# Patient Record
Sex: Female | Born: 1990 | Race: White | Hispanic: No | Marital: Single | Smoking: Never smoker
Health system: Southern US, Community
[De-identification: ages and names within clinical notes are randomized; demographics above are authoritative.]

## PROBLEM LIST (undated history)

## (undated) DIAGNOSIS — Z87442 Personal history of urinary calculi: Secondary | ICD-10-CM

## (undated) DIAGNOSIS — M1990 Unspecified osteoarthritis, unspecified site: Secondary | ICD-10-CM

## (undated) HISTORY — DX: Unspecified osteoarthritis, unspecified site: M19.90

## (undated) HISTORY — PX: WISDOM TOOTH EXTRACTION: SHX21

## (undated) HISTORY — DX: Personal history of urinary calculi: Z87.442

---

## 2021-05-11 ENCOUNTER — Ambulatory Visit (INDEPENDENT_AMBULATORY_CARE_PROVIDER_SITE_OTHER): Payer: Commercial Managed Care - PPO | Admitting: Family Medicine

## 2021-05-11 ENCOUNTER — Encounter: Payer: Self-pay | Admitting: Family Medicine

## 2021-05-11 VITALS — BP 130/82 | HR 87 | Temp 98.3°F | Ht 62.0 in | Wt 171.2 lb

## 2021-05-11 DIAGNOSIS — R002 Palpitations: Secondary | ICD-10-CM | POA: Diagnosis not present

## 2021-05-11 DIAGNOSIS — G8929 Other chronic pain: Secondary | ICD-10-CM

## 2021-05-11 DIAGNOSIS — M25561 Pain in right knee: Secondary | ICD-10-CM

## 2021-05-11 DIAGNOSIS — M25562 Pain in left knee: Secondary | ICD-10-CM

## 2021-05-11 DIAGNOSIS — F4323 Adjustment disorder with mixed anxiety and depressed mood: Secondary | ICD-10-CM | POA: Diagnosis not present

## 2021-05-11 DIAGNOSIS — G4489 Other headache syndrome: Secondary | ICD-10-CM

## 2021-05-11 DIAGNOSIS — Z8249 Family history of ischemic heart disease and other diseases of the circulatory system: Secondary | ICD-10-CM

## 2021-05-11 DIAGNOSIS — R1084 Generalized abdominal pain: Secondary | ICD-10-CM | POA: Diagnosis not present

## 2021-05-11 DIAGNOSIS — R519 Headache, unspecified: Secondary | ICD-10-CM

## 2021-05-11 DIAGNOSIS — R0789 Other chest pain: Secondary | ICD-10-CM

## 2021-05-11 LAB — CBC WITH DIFFERENTIAL/PLATELET
Basophils Absolute: 0 10*3/uL (ref 0.0–0.1)
Basophils Relative: 0.4 % (ref 0.0–3.0)
Eosinophils Absolute: 0.1 10*3/uL (ref 0.0–0.7)
Eosinophils Relative: 1 % (ref 0.0–5.0)
HCT: 44 % (ref 36.0–46.0)
Hemoglobin: 14.6 g/dL (ref 12.0–15.0)
Lymphocytes Relative: 30.3 % (ref 12.0–46.0)
Lymphs Abs: 2.1 10*3/uL (ref 0.7–4.0)
MCHC: 33.2 g/dL (ref 30.0–36.0)
MCV: 90.9 fl (ref 78.0–100.0)
Monocytes Absolute: 0.3 10*3/uL (ref 0.1–1.0)
Monocytes Relative: 4.2 % (ref 3.0–12.0)
Neutro Abs: 4.4 10*3/uL (ref 1.4–7.7)
Neutrophils Relative %: 64.1 % (ref 43.0–77.0)
Platelets: 399 10*3/uL (ref 150.0–400.0)
RBC: 4.84 Mil/uL (ref 3.87–5.11)
RDW: 13.5 % (ref 11.5–15.5)
WBC: 6.9 10*3/uL (ref 4.0–10.5)

## 2021-05-11 LAB — URINALYSIS, ROUTINE W REFLEX MICROSCOPIC
Ketones, ur: NEGATIVE
Nitrite: NEGATIVE
Specific Gravity, Urine: >=1.03 — AB (ref 1.000–1.030)
Urine Glucose: NEGATIVE
Urobilinogen, UA: 0.2 (ref 0.0–1.0)
pH: 5.5 (ref 5.0–8.0)

## 2021-05-11 LAB — COMPREHENSIVE METABOLIC PANEL
ALT: 10 U/L (ref 0–35)
AST: 14 U/L (ref 0–37)
Albumin: 4 g/dL (ref 3.5–5.2)
Alkaline Phosphatase: 72 U/L (ref 39–117)
BUN: 10 mg/dL (ref 6–23)
CO2: 24 mEq/L (ref 19–32)
Calcium: 8.9 mg/dL (ref 8.4–10.5)
Chloride: 106 mEq/L (ref 96–112)
Creatinine, Ser: 0.67 mg/dL (ref 0.40–1.20)
GFR: 116.59 mL/min (ref >60.00)
Glucose, Bld: 95 mg/dL (ref 70–99)
Potassium: 4.1 mEq/L (ref 3.5–5.1)
Sodium: 139 mEq/L (ref 135–145)
Total Bilirubin: 0.5 mg/dL (ref 0.2–1.2)
Total Protein: 7 g/dL (ref 6.0–8.3)

## 2021-05-11 LAB — HEMOGLOBIN A1C: Hgb A1c MFr Bld: 5.4 % (ref 4.6–6.5)

## 2021-05-11 MED ORDER — MELOXICAM 15 MG PO TABS: 15.0000 mg | ORAL_TABLET | Freq: Every day | ORAL | 1 refills | Status: DC

## 2021-05-11 MED ORDER — DULOXETINE HCL 30 MG PO CPEP: 30.0000 mg | ORAL_CAPSULE | Freq: Every day | ORAL | 1 refills | Status: DC

## 2021-05-11 NOTE — Patient Instructions (Signed)
Welcome to [facility] at [facility]! It was a pleasure meeting you today.  As discussed, Please schedule a 1 month follow up visit today.  PLEASE NOTE:  If you had any LAB tests please let us know if you have not heard back within a few days. You may see your results on MyChart before we have a chance to review them but we will give you a call once they are reviewed by us. If we ordered any REFERRALS today, please let us know if you have not heard from their office within the next week.  Let us know through MyChart if you are needing REFILLS, or have your pharmacy send us the request. You can also use MyChart to communicate with me or any office staff.  Please try these tips to maintain a healthy lifestyle:  Eat most of your calories during the day when you are active. Eliminate processed foods including packaged sweets (pies, cakes, cookies), reduce intake of potatoes, white bread, white pasta, and white rice. Look for whole grain options, oat flour or almond flour.  Each meal should contain half fruits/vegetables, one quarter protein, and one quarter carbs (no bigger than a computer mouse).  Cut down on sweet beverages. This includes juice, soda, and sweet tea. Also watch fruit intake, though this is a healthier sweet option, it still contains natural sugar! Limit to 3 servings daily.  Drink at least 1 glass of water with each meal and aim for at least 8 glasses per day  Exercise at least 150 minutes every week.   

## 2021-05-11 NOTE — Progress Notes (Signed)
? ?New Patient Office Visit ? ?Subjective:  ?Patient ID: Belinda Rice, female    DOB: Feb 04, 1991  Age: 31 y.o. MRN: [id number] ? ?CC:  ?Chief Complaint  ?Patient presents with  ? Establish Care  ?  Need new PCP  ? ? ?HPI ?Claretta Kimpel presents for new pt.  Mom is nurse and encouraged pt to ask for certain things ? ?New pt.  From [place]-Living in [place].   Pre-Law student ? Has some type of heart condition-saw Card in [place] at [facility] a serious heart condition".  Gets light-headed/tachy.  W/u in 03/2018.   Went to [employer] in [place]. [place]-has "flap" on ventricle.   Can get CP, lightheaded, cough, HR >100 freq.  ??EDS or Mast Cell-was supposed to refer to Genetics-joints can "slip in and out".  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   Elbow can slip while in bed.  Has some arthritis in knees-pain at times. ?Fx nose in past-so nose causing some tension HA -pressure in eyes/face-some photo. Pt requesting to see ENT ?Abd pain-has had polyps in intestines when 31yo-cscope for BRBR.  No stool changes, but more pain in abd-not related to food or lack of.  Occ shooting pains L side/kidney-stabbing at times.  ?FH DM pat side ?1/2 bro dx brain aneurysm at 55 and Gma had as well-used to get MRI's in [place].   ? ?LMP Dec-on nuvaring-stressed.  U preg neg ?Stress-not do well on prozac-kept her awake. Saw friend get killed.in past-ADD meds.  Procrastinates.  No SI ?Jewish ? ? ? ?Past Medical History:  ?Diagnosis Date  ? Arthritis   ? History of kidney stones   ? ? ?Past Surgical History:  ?Procedure Laterality Date  ? WISDOM TOOTH EXTRACTION    ? ? ?Family History  ?Problem Relation Age of Onset  ? Diabetes Father   ? Cancer Maternal Grandmother   ? Brain cancer Maternal Grandmother   ? Prostate cancer Maternal Grandfather   ? Cancer Maternal Grandfather   ? ? ?Social History  ? ?Socioeconomic History  ? Marital status: Single  ?  Spouse name: Not on file  ? Number of  children: 0  ? Years of education: Not on file  ? Highest education level: Not on file  ?Occupational History  ? Not on file  ?Tobacco Use  ? Smoking status: Never  ? Smokeless tobacco: Never  ?Vaping Use  ? Vaping Use: Never used  ?Substance and Sexual Activity  ? Alcohol use: Never  ? Drug use: Never  ? Sexual activity: Yes  ?  Comment: Nuvaring  ?Other Topics Concern  ? Not on file  ?Social History Narrative  ? Part-time Bakery  ? 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  ? ?Social Determinants of Health  ? ?Financial Resource Strain: Not on file  ?Food Insecurity: Not on file  ?Transportation Needs: Not on file  ?Physical Activity: Not on file  ?Stress: Not on file  ?Social Connections: Not on file  ?Intimate Partner Violence: Not on file  ? ? ?ROS -see HPI-time constraints so not able to complete ? ? ?Objective:  ? ?Today's Vitals: BP 130/82   Pulse 87   Temp 98.3 ?F (36.8 ?C) (Temporal)   Ht '5\' 2"'$  (1.575 m)   Wt 171 lb 4 oz (77.7 kg)   LMP  (LMP Unknown) Comment: think it was December, due to stress  SpO2 98%   BMI 31.32 kg/m?  ? ?Physical Exam  ?Gen:  WDWN NAD OWF ?HEENT: NCAT, conjunctiva not injected, sclera nonicteric ?TM WNL B, OP moist, no exudates  ?NECK:  supple, no thyromegaly, no nodes, no carotid bruits ?CARDIAC: RRR, S1S2+, no murmur. DP 2+B ?LUNGS: CTAB. No wheezes ?ABDOMEN:  BS+, soft, mildly tender all over. No HSM, no masses ?EXT:  no edema ?MSK: no gross abnormalities.  ?NEURO: A&O x3.  CN II-XII intact.  ?PSYCH: normal mood. Good eye contact  ? ?Assessment & Plan:  ? ?Problem List Items Addressed This Visit   ? ?  ? Cardiovascular and Mediastinum  ? Family history of brain aneurysm  ?  ? Other  ? Chronic pain of both knees  ? Relevant Medications  ? DULoxetine (CYMBALTA) 30 MG capsule  ? meloxicam (MOBIC) 15 MG tablet  ? Other Relevant Orders  ? Ambulatory referral to Orthopedic Surgery  ? ?Other Visit Diagnoses   ? ? Adjustment disorder with mixed anxiety and depressed  mood    -  Primary  ? Palpitation      ? Relevant Orders  ? CBC with Differential/Platelet  ? Comprehensive metabolic panel  ? Hemoglobin A1c  ? TSH  ? Urinalysis, Routine w reflex microscopic  ? Ambulatory referral to Cardiology  ? Other headache syndrome      ? Relevant Medications  ? DULoxetine (CYMBALTA) 30 MG capsule  ? meloxicam (MOBIC) 15 MG tablet  ? Generalized abdominal pain      ? Relevant Orders  ? CBC with Differential/Platelet  ? Comprehensive metabolic panel  ? Hemoglobin A1c  ? TSH  ? hCG, serum, qualitative  ? Urinalysis, Routine w reflex microscopic  ? CT Abdomen Pelvis W Contrast  ? Other chest pain      ? Relevant Orders  ? Ambulatory referral to Cardiology  ? ?  ?1.  Adjustment disorder with anxiety/depression-we will start Cymbalta.  Follow-up in 1 month ?2.  Palpitations/chest pain/structural heart disease-Will refer to cardiology.  Check CBC, CMP, TSH for other causes ?3.  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  Requesting referral to geneticist.  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  Will trial Cymbalta and meloxicam.  Work on weight loss. ?4.  Diffuse abdominal pain.  Has history of intestinal polyps.  Has a lot going on.  Will check serum pregnancy test (urine pregnancy test is already been negative at home.).  Check CBC, CMP, UA.  Refer for CT abdomen ?5.  Chronic headaches-patient feels this is due to an old nasal fracture.  She is requesting a referral to ENT.  Also, family history of aneurysms.  She used to get MRIs.  Adequately done in [place].  We will schedule for an MRI. ? ?Complex patient.  Spent 40 minutes listening to the patient, obtaining her history.  Advised that we could not address all of her needs at 1 time.  Spent an additional 10 minutes devising a plan, ordering labs, medications, and discussing side effects.  Total of 50 minutes spent face-to-face with the patient. ?Outpatient Encounter  Medications as of 05/11/2021  ?Medication Sig  ? DULoxetine (CYMBALTA) 30 MG capsule Take 1 capsule (30 mg total) by mouth daily.  ? etonogestrel-ethinyl estradiol (NUVARING) 0.12-0.015 MG/24HR vaginal ring Place vaginally.  ? meloxicam (MOBIC) 15 MG tablet Take 1 tablet (15 mg total) by mouth daily.  ? ?No facility-administered encounter medications on file as of 05/11/2021.  ? ? ?Follow-up: Return for 1 mo-f/u moods, ha.  ? ?Wellington Hampshire,  MD ? ?

## 2021-05-13 DIAGNOSIS — Z8249 Family history of ischemic heart disease and other diseases of the circulatory system: Secondary | ICD-10-CM | POA: Insufficient documentation

## 2021-05-15 ENCOUNTER — Other Ambulatory Visit (HOSPITAL_BASED_OUTPATIENT_CLINIC_OR_DEPARTMENT_OTHER): Payer: Self-pay | Admitting: Orthopaedic Surgery

## 2021-05-15 DIAGNOSIS — M25561 Pain in right knee: Secondary | ICD-10-CM

## 2021-05-15 LAB — TSH: TSH: 1.3 u[IU]/mL (ref 0.35–5.50)

## 2021-05-16 ENCOUNTER — Other Ambulatory Visit: Payer: Self-pay | Admitting: *Deleted

## 2021-05-16 ENCOUNTER — Other Ambulatory Visit: Payer: Self-pay

## 2021-05-16 ENCOUNTER — Ambulatory Visit (HOSPITAL_BASED_OUTPATIENT_CLINIC_OR_DEPARTMENT_OTHER)
Admission: RE | Admit: 2021-05-16 | Discharge: 2021-05-16 | Disposition: A | Discharge status: Home / Self Care | Payer: Commercial Managed Care - PPO | Source: Ambulatory Visit | Attending: Orthopaedic Surgery | Admitting: Orthopaedic Surgery

## 2021-05-16 ENCOUNTER — Ambulatory Visit (INDEPENDENT_AMBULATORY_CARE_PROVIDER_SITE_OTHER): Payer: Commercial Managed Care - PPO | Admitting: Orthopaedic Surgery

## 2021-05-16 DIAGNOSIS — M25361 Other instability, right knee: Secondary | ICD-10-CM | POA: Diagnosis not present

## 2021-05-16 DIAGNOSIS — M25561 Pain in right knee: Secondary | ICD-10-CM | POA: Diagnosis not present

## 2021-05-16 DIAGNOSIS — M25562 Pain in left knee: Secondary | ICD-10-CM | POA: Diagnosis not present

## 2021-05-16 DIAGNOSIS — M25362 Other instability, left knee: Secondary | ICD-10-CM | POA: Diagnosis not present

## 2021-05-16 MED ORDER — CEPHALEXIN 500 MG PO CAPS
500.0000 mg | ORAL_CAPSULE | Freq: Three times a day (TID) | ORAL | 0 refills | Status: AC
Start: 2021-05-16 — End: 2021-05-23

## 2021-05-16 NOTE — Progress Notes (Signed)
? ?                            ? ? ?Chief Complaint: Bilateral patellar instability ?  ? ? ?History of Present Illness:  ? ? ?Belinda Rice is a 31 y.o. female presents today with right worse than left patellar instability.  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  She states that when she was 14 she had a history of dislocation of both of her patellas.  This subsequently was treated with a progressive period of physical therapy for over 6 months.  Unfortunately this has begun to recur and now happens multiple times approximately 5 yearly.  She states that she can just be standing and the right in particular will dislocate.  These typically do not require any type of formal relocation event.  She does prefer to stay active although she is no longer able to play hockey as result of this.  She does ride horses although her unstable knees limit this.  She has not had any injections.  She does enjoy going to the gym and working on the legs although this has been limited as result of her recurrent instability.  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  She does live in [place] is here today for further assessment.  She does endorse a history of laxity in multiple joints including shoulders. ? ? ? ?Surgical History:   ?None ? ?PMH/PSH/Family History/Social History/Meds/Allergies:   ? ?Past Medical History:  ?Diagnosis Date  ? Arthritis   ? History of kidney stones   ? ?Past Surgical History:  ?Procedure Laterality Date  ? WISDOM TOOTH EXTRACTION    ? ?Social History  ? ?Socioeconomic History  ? Marital status: Single  ?  Spouse name: Not on file  ? Number of children: 0  ? Years of education: Not on file  ? Highest education level: Not on file  ?Occupational History  ? Not on file  ?Tobacco Use  ? Smoking status: Never  ? Smokeless tobacco: Never  ?Vaping Use  ? Vaping Use: Never used  ?Substance and Sexual Activity  ? Alcohol use: Never  ? Drug use: Never   ? Sexual activity: Yes  ?  Comment: Nuvaring  ?Other Topics Concern  ? Not on file  ?Social History Narrative  ? Part-time Bakery  ? 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  ? ?Social Determinants of Health  ? ?Financial Resource Strain: Not on file  ?Food Insecurity: Not on file  ?Transportation Needs: Not on file  ?Physical Activity: Not on file  ?Stress: Not on file  ?Social Connections: Not on file  ? ?Family History  ?Problem Relation Age of Onset  ? Diabetes Father   ? Cancer Maternal Grandmother   ? Brain cancer Maternal Grandmother   ? Prostate cancer Maternal Grandfather   ? Cancer Maternal Grandfather   ? ?Allergies  ?Allergen Reactions  ? Sulfites Other (See Comments)  ?  Nausea, vomiting, swelling  ? ?Current Outpatient Medications  ?Medication Sig Dispense Refill  ? cephALEXin (KEFLEX) 500 MG capsule Take 1 capsule (500 mg total) by mouth 3 (three) times daily for 7 days. 21 capsule 0  ? DULoxetine (CYMBALTA) 30 MG capsule Take 1 capsule (30 mg total) by mouth daily. 30 capsule 1  ? etonogestrel-ethinyl estradiol (NUVARING) 0.12-0.015 MG/24HR vaginal ring Place vaginally.    ?  meloxicam (MOBIC) 15 MG tablet Take 1 tablet (15 mg total) by mouth daily. 30 tablet 1  ? ?No current facility-administered medications for this visit.  ? ?No results found. ? ?Review of Systems:   ?A ROS was performed including pertinent positives and negatives as documented in the HPI. ? ?Physical Exam :   ?Constitutional: NAD and appears stated age ?Neurological: Alert and oriented ?Psych: Appropriate affect and cooperative ?There were no vitals taken for this visit.  ? ?Comprehensive Musculoskeletal Exam:   ? ?  ?Musculoskeletal Exam  ?Gait Normal  ?Alignment Normal  ? Right Left  ?Inspection Normal Normal  ?Palpation    ?Tenderness None None  ?Crepitus None None  ?Effusion None None  ?Range of Motion    ?Extension -5 -5  ?Flexion 135 135  ?Strength    ?Extension 5/5 5/5  ?Flexion 5/5 5/5  ?Ligament Exam      ?Generalized Laxity No No  ?Lachman Negative Negative   ?Pivot Shift Negative Negative  ?Anterior Drawer Negative Negative  ?Valgus at 0 Negative Negative  ?Valgus at 20 Negative Negative  ?Varus at 0 0 0  ?Varus at 20   0 0  ?Posterior Drawer at 90 0 0  ?Vascular/Lymphatic Exam    ?Edema None None  ?Venous Stasis Changes No No  ?Distal Circulation Normal Normal  ?Neurologic    ?Light Touch Sensation Intact Intact  ?Special Tests:   ? ?She has 3 quadrants of lateral patella subluxation bilaterally.  This is not painful for her.  1 quadrant of medial ability bilaterally ? ?She has all positive findings on Beighton testing today. ? ?Imaging:   ?Xray (4 views right knee, 4 views left knee): ?There is lateral patella subluxation is visualized on the AP x-ray ? ?I personally reviewed and interpreted the radiographs. ? ? 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  She states that she is having up to 5 subluxations or dislocations a year.  This is significantly bothersome to her when this occurs.  She has trialed a specific stent of physical therapy for many months previously although she was very frustrated as this continues to recur.  She does have a history of ligamentous laxity in other joints and has a very high Beighton score.  That being said her patella instability are the only joints that really bother her.  She is preferring to stay very active at this time and her patella instability is quite bothersome to her.  Given the fact that she has trialed over-the-counter bracing with limited relief in addition to physical therapy and continues to have persistent knee instability, I do believe that MRIs are indicated to assess for any type of underlying cartilage injury and to further quantify her TT-TG distance. ? ?Plan :   ? ?-Plan for MRI bilateral knees return to clinic for further discussion ? ?I believe that advance imaging in the form of an MRI  is indicated for the following reasons: ?-Xrays images were obtained and not diagnostic ?-The patient has failed treatment modalities including rest, activity restriction, over-the-counter bracing, physical therapy for 6 months ?-The following worrisome symptoms are present on history and exam: Persistent lateral subluxation in the setting of ligamentous laxity ? ? ? ? ? ? ?I personally saw and evaluated the patient, and participated in the management and treatment plan. ? ?Vanetta Mulders, MD ?Attending Physician, Orthopedic Surgery ? ?This document was dictated using Systems analyst. A reasonable attempt  at proof reading has been made to minimize errors. ?

## 2021-05-18 NOTE — Addendum Note (Signed)
Addended by: Wellington Hampshire on: 05/18/2021 05:11 PM ? ? Modules accepted: Orders ? ?

## 2021-05-18 NOTE — Progress Notes (Signed)
Patient notified. Patient stated that she would wait until her appointment in April to have lab done. She is waiting on her period to start, if it doesn't start by Wednesday, she may stop by to have lab done because she will be in the area for some imaging. Patient stated that she would call office and let us know her decision.  ?

## 2021-05-20 ENCOUNTER — Ambulatory Visit
Admission: RE | Admit: 2021-05-20 | Discharge: 2021-05-20 | Disposition: A | Discharge status: Home / Self Care | Payer: Commercial Managed Care - PPO | Source: Ambulatory Visit | Attending: Family Medicine | Admitting: Family Medicine

## 2021-05-20 ENCOUNTER — Ambulatory Visit
Admission: RE | Admit: 2021-05-20 | Discharge: 2021-05-20 | Disposition: A | Discharge status: Home / Self Care | Payer: Commercial Managed Care - PPO | Source: Ambulatory Visit | Attending: Orthopaedic Surgery | Admitting: Orthopaedic Surgery

## 2021-05-20 DIAGNOSIS — M25561 Pain in right knee: Secondary | ICD-10-CM

## 2021-05-20 DIAGNOSIS — Z8249 Family history of ischemic heart disease and other diseases of the circulatory system: Secondary | ICD-10-CM

## 2021-05-20 DIAGNOSIS — G8929 Other chronic pain: Secondary | ICD-10-CM

## 2021-05-20 DIAGNOSIS — G4489 Other headache syndrome: Secondary | ICD-10-CM

## 2021-05-23 ENCOUNTER — Ambulatory Visit (HOSPITAL_BASED_OUTPATIENT_CLINIC_OR_DEPARTMENT_OTHER): Payer: Self-pay | Admitting: Orthopaedic Surgery

## 2021-05-23 ENCOUNTER — Ambulatory Visit (INDEPENDENT_AMBULATORY_CARE_PROVIDER_SITE_OTHER): Payer: Commercial Managed Care - PPO | Admitting: Orthopaedic Surgery

## 2021-05-23 DIAGNOSIS — M25361 Other instability, right knee: Secondary | ICD-10-CM

## 2021-05-23 DIAGNOSIS — M25362 Other instability, left knee: Secondary | ICD-10-CM | POA: Diagnosis not present

## 2021-05-23 NOTE — Progress Notes (Addendum)
? ?                            ? ? ?Chief Complaint: Bilateral patellar instability ?  ? ? ?History of Present Illness:  ? ?05/23/2021: Presents today for follow-up of her bilateral knees.  She is here today for MRI review.  She continues to have persistent instability of both knees. ? ? ?Belinda Rice is a 31 y.o. female presents today with right worse than left patellar instability.  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  She states that when she was 14 she had a history of dislocation of both of her patellas.  This subsequently was treated with a progressive period of physical therapy for over 6 months.  Unfortunately this has begun to recur and now happens multiple times approximately 5 yearly.  She states that she can just be standing and the right in particular will dislocate.  These typically do not require any type of formal relocation event.  She does prefer to stay active although she is no longer able to play hockey as result of this.  She does ride horses although her unstable knees limit this.  She has not had any injections.  She does enjoy going to the gym and working on the legs although this has been limited as result of her recurrent instability.  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  She does live in [place] is here today for further assessment.  She does endorse a history of laxity in multiple joints including shoulders. ? ? ? ?Surgical History:   ?None ? ?PMH/PSH/Family History/Social History/Meds/Allergies:   ? ?Past Medical History:  ?Diagnosis Date  ? Arthritis   ? History of kidney stones   ? ?Past Surgical History:  ?Procedure Laterality Date  ? WISDOM TOOTH EXTRACTION    ? ?Social History  ? ?Socioeconomic History  ? Marital status: Single  ?  Spouse name: Not on file  ? Number of children: 0  ? Years of education: Not on file  ? Highest education level: Not on file  ?Occupational History  ? Not on file  ?Tobacco Use  ?  Smoking status: Never  ? Smokeless tobacco: Never  ?Vaping Use  ? Vaping Use: Never used  ?Substance and Sexual Activity  ? Alcohol use: Never  ? Drug use: Never  ? Sexual activity: Yes  ?  Comment: Nuvaring  ?Other Topics Concern  ? Not on file  ?Social History Narrative  ? Part-time Bakery  ? 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  ? ?Social Determinants of Health  ? ?Financial Resource Strain: Not on file  ?Food Insecurity: Not on file  ?Transportation Needs: Not on file  ?Physical Activity: Not on file  ?Stress: Not on file  ?Social Connections: Not on file  ? ?Family History  ?Problem Relation Age of Onset  ? Diabetes Father   ? Cancer Maternal Grandmother   ? Brain cancer Maternal Grandmother   ? Prostate cancer Maternal Grandfather   ? Cancer Maternal Grandfather   ? ?Allergies  ?Allergen Reactions  ? Sulfites Other (See Comments)  ?  Nausea, vomiting, swelling  ? ?Current Outpatient Medications  ?Medication Sig Dispense Refill  ? cephALEXin (KEFLEX) 500 MG capsule Take 1 capsule (500 mg total) by mouth 3 (three) times daily for 7 days. 21 capsule 0  ? DULoxetine (CYMBALTA)  30 MG capsule Take 1 capsule (30 mg total) by mouth daily. 30 capsule 1  ? etonogestrel-ethinyl estradiol (NUVARING) 0.12-0.015 MG/24HR vaginal ring Place vaginally.    ? meloxicam (MOBIC) 15 MG tablet Take 1 tablet (15 mg total) by mouth daily. 30 tablet 1  ? ?No current facility-administered medications for this visit.  ? ?No results found. ? ?Review of Systems:   ?A ROS was performed including pertinent positives and negatives as documented in the HPI. ? ?Physical Exam :   ?Constitutional: NAD and appears stated age ?Neurological: Alert and oriented ?Psych: Appropriate affect and cooperative ?There were no vitals taken for this visit.  ? ?Comprehensive Musculoskeletal Exam:   ? ?  ?Musculoskeletal Exam  ?Gait Normal  ?Alignment Normal  ? Right Left  ?Inspection Normal Normal  ?Palpation    ?Tenderness None None  ?Crepitus  None None  ?Effusion None None  ?Range of Motion    ?Extension -5 -5  ?Flexion 135 135  ?Strength    ?Extension 5/5 5/5  ?Flexion 5/5 5/5  ?Ligament Exam     ?Generalized Laxity No No  ?Lachman Negative Negative   ?Pivot Shift Negative Negative  ?Anterior Drawer Negative Negative  ?Valgus at 0 Negative Negative  ?Valgus at 20 Negative Negative  ?Varus at 0 0 0  ?Varus at 20   0 0  ?Posterior Drawer at 90 0 0  ?Vascular/Lymphatic Exam    ?Edema None None  ?Venous Stasis Changes No No  ?Distal Circulation Normal Normal  ?Neurologic    ?Light Touch Sensation Intact Intact  ?Special Tests:   ? ?She has 3 quadrants of lateral patella subluxation bilaterally.  Positive J sign.  Positive patella apprehension worse on the right than left.  This is not painful for her.  1 quadrant of medial mobility bilaterally ? ?She has all positive findings on Beighton testing today. ? ?Imaging:   ?Xray (4 views right knee, 4 views left knee): ?There is lateral patella subluxation is visualized on the AP x-ray ? ?MRI right knee, MRI left knee: ?Right knee: There is significant attenuation of the medial patellofemoral ligament with lateral subluxation.  Mild trochlear dysplasia/shallowing. There is superficial chondral wearing involving the lateral patellar facet.  TT-TG distance is 14.  CDI is 1.2 ?Left knee: There is significant attenuation of the medial patellofemoral ligament with lateral subluxation.  Mild trochlear dysplasia/shallowing.There is superficial chondral wearing involving the lateral patellar facet.  TT-TG distance is 15.  CDI is 1.2 ? ?I personally reviewed and interpreted the radiographs. ? ? 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  She states that she is having up to 5 subluxations or dislocations a year.  This is significantly bothersome to her when this occurs.  She has trialed a specific stent of physical therapy for many months previously although  she was very frustrated as this continues to recur.  She does have a history of ligamentous laxity in other joints and has a very high Beighton score.  That being said her patella instability are the only joints that really bother her.  She is preferring to stay very active at this time and her patella instability is quite bothersome to her.  She has trialed physical therapy for both knees for several months previously without any type of impact on her knee instability.  Given the fact that she has trialed over-the-counter bracing with limited relief in addition to physical therapy and continues to have  persistent knee instability, at today's visit we did discuss possible surgical intervention.  Her TT-TG distance on the right which is the more symptomatic side is approximately 14.  I described that this is less than my typical cutoff of 15 for a tibial tubercle osteotomy.  We did discuss that as result I would ultimately recommend MPFL reconstruction.  She does not have any specific knee pain or significant swelling consistent with chondral loss.  Her primary complaint is instability of the patella which I do believe would be restored with MPFL reconstruction.  I will also plan for knee arthroscopy with chondroplasty and cartilage harvest at that time. Shoulder her cartilage symptoms become more persistent in the future we could ultimately have the option for a MACI procedure with a cartilage harvest.  That being said at today's visit she does not have any knee pain aside from apprehension with patella instability.  At today's visit we did have a long discussion and she would ultimately like to pursue knee arthroscopy with medial patellofemoral ligament reconstruction.  We have also discussed the possibility of a MACI cartilage harvest which we will pursue. ? ?Plan :   ? ?-Plan for right knee medial patellofemoral ligament reconstruction with knee arthroscopy and cartilage harvest ? ? ? ?After a lengthy discussion  of treatment options, including risks, benefits, alternatives, complications of surgical and nonsurgical conservative options, the patient elected surgical repair.  ? ?The patient  is aware of the m

## 2021-05-24 ENCOUNTER — Ambulatory Visit
Admission: RE | Admit: 2021-05-24 | Discharge: 2021-05-24 | Disposition: A | Discharge status: Home / Self Care | Payer: Commercial Managed Care - PPO | Source: Ambulatory Visit | Attending: Family Medicine | Admitting: Family Medicine

## 2021-05-24 DIAGNOSIS — R1084 Generalized abdominal pain: Secondary | ICD-10-CM

## 2021-05-24 MED ORDER — IOPAMIDOL (ISOVUE-300) INJECTION 61%
100.0000 mL | Freq: Once | INTRAVENOUS | Status: AC | PRN
  Administered 2021-05-24: 100 mL via INTRAVENOUS

## 2021-06-07 ENCOUNTER — Ambulatory Visit: Payer: Commercial Managed Care - PPO | Admitting: Family Medicine

## 2021-06-08 ENCOUNTER — Ambulatory Visit (INDEPENDENT_AMBULATORY_CARE_PROVIDER_SITE_OTHER): Payer: Commercial Managed Care - PPO | Admitting: Family Medicine

## 2021-06-08 ENCOUNTER — Encounter: Payer: Self-pay | Admitting: Family Medicine

## 2021-06-08 VITALS — BP 118/72 | HR 69 | Temp 98.4°F | Ht 62.0 in | Wt 172.4 lb

## 2021-06-08 DIAGNOSIS — F4323 Adjustment disorder with mixed anxiety and depressed mood: Secondary | ICD-10-CM | POA: Diagnosis not present

## 2021-06-08 DIAGNOSIS — R1084 Generalized abdominal pain: Secondary | ICD-10-CM

## 2021-06-08 MED ORDER — OMEPRAZOLE 40 MG PO CPDR: 40.0000 mg | DELAYED_RELEASE_CAPSULE | Freq: Every day | ORAL | 1 refills | Status: DC

## 2021-06-08 MED ORDER — DULOXETINE HCL 30 MG PO CPEP: 30.0000 mg | ORAL_CAPSULE | Freq: Every day | ORAL | 1 refills | Status: AC

## 2021-06-08 NOTE — Progress Notes (Signed)
? ?Subjective:  ? ? ? Patient ID: Belinda Rice, female    DOB: [birth year], 31 y.o.   MRN: [id number] ? ?Chief Complaint  ?Patient presents with  ? Follow-up  ?  1 month follow-up on moods, ha  ? ? ?HPI ? Adjustment disorder w/mixed anx/depression.  Started cymbalta in eve-was up all night.  Taking in am, and much better as far as side effects.  Pain is some better-less HA.  Moods some better. No SI.  Happy w/effect ?Abd pain-intermitt still-ate sandwich and 50mn later mid epi pain.  Twice in 1 month, woke her from sleep-stomach was empty.  ?Card appt on 5/2 ?Needs knee surgery but waiting for Card. ? ?Health Maintenance Due  ?Topic Date Due  ? HIV Screening  Never done  ? Hepatitis C Screening  Never done  ? TETANUS/TDAP  Never done  ? PAP SMEAR-Modifier  Never done  ? ? ?Past Medical History:  ?Diagnosis Date  ? Arthritis   ? History of kidney stones   ? ? ?Past Surgical History:  ?Procedure Laterality Date  ? WISDOM TOOTH EXTRACTION    ? ? ?Outpatient Medications Prior to Visit  ?Medication Sig Dispense Refill  ? etonogestrel-ethinyl estradiol (NUVARING) 0.12-0.015 MG/24HR vaginal ring Place vaginally.    ? meloxicam (MOBIC) 15 MG tablet Take 1 tablet (15 mg total) by mouth daily. 30 tablet 1  ? DULoxetine (CYMBALTA) 30 MG capsule Take 1 capsule (30 mg total) by mouth daily. 30 capsule 1  ? ?No facility-administered medications prior to visit.  ? ? ?Allergies  ?Allergen Reactions  ? Sulfites Other (See Comments)  ?  Nausea, vomiting, swelling  ? ?ROS neg/noncontributory except as noted HPI/below ? ? ?   ?Objective:  ?  ? ?BP 118/72   Pulse 69   Temp 98.4 ?F (36.9 ?C) (Temporal)   Ht '5\' 2"'$  (1.575 m)   Wt 172 lb 6 oz (78.2 kg)   LMP 05/24/2021 (Within Days)   SpO2 99%   BMI 31.53 kg/m?  ?Wt Readings from Last 3 Encounters:  ?06/08/21 172 lb 6 oz (78.2 kg)  ?05/11/21 171 lb 4 oz (77.7 kg)  ? ? ?Physical Exam  ? ?Gen: WDWN NAD ?HEENT: NCAT, conjunctiva not injected, sclera nonicteric ?CARDIAC: RRR, S1S2+,  no murmur. DP 2+B ?ABDOMEN:  BS+, soft, mildly tender R side and mid epi, No HSM, no masses ?EXT:  no edema ?MSK: no gross abnormalities.  ?NEURO: A&O x3.  CN II-XII intact.  ?PSYCH: normal mood. Good eye contact ? ?   ?Assessment & Plan:  ? ?Problem List Items Addressed This Visit   ?None ?Visit Diagnoses   ? ? Adjustment disorder with mixed anxiety and depressed mood    -  Primary  ? Relevant Medications  ? DULoxetine (CYMBALTA) 30 MG capsule  ? Generalized abdominal pain      ? Relevant Medications  ? omeprazole (PRILOSEC) 40 MG capsule  ? Other Relevant Orders  ? Ambulatory referral to Gastroenterology  ? ?  ? Adjusment disorder-mixed-better on cymbalta '30mg'$ .  Pt happy and not want to increase at this time.  Renewed the '30mg'$  for 6 mo until f/u.  If feels need to increase, call ?Abd pain-CT normal.  ?functional.  Will do prilosec '40mg'$  daily.  Refer to GI.   ? ?Meds ordered this encounter  ?Medications  ? DULoxetine (CYMBALTA) 30 MG capsule  ?  Sig: Take 1 capsule (30 mg total) by mouth daily.  ?  Dispense:  90 capsule  ?  Refill:  1  ? omeprazole (PRILOSEC) 40 MG capsule  ?  Sig: Take 1 capsule (40 mg total) by mouth daily.  ?  Dispense:  90 capsule  ?  Refill:  1  ? ? ?Wellington Hampshire, MD ? ?

## 2021-06-08 NOTE — Patient Instructions (Signed)
It was very nice to see you today! ? ?Referring to GI ? ? ?PLEASE NOTE: ? ?If you had any lab tests please let us know if you have not heard back within a few days. You may see your results on MyChart before we have a chance to review them but we will give you a call once they are reviewed by [place]. If we ordered any referrals today, please let us know if you have not heard from their office within the next week.  ? ?Please try these tips to maintain a healthy lifestyle: ? ?Eat most of your calories during the day when you are active. Eliminate processed foods including packaged sweets (pies, cakes, cookies), reduce intake of potatoes, white bread, white pasta, and white rice. Look for whole grain options, oat flour or almond flour. ? ?Each meal should contain half fruits/vegetables, one quarter protein, and one quarter carbs (no bigger than a computer mouse). ? ?Cut down on sweet beverages. This includes juice, soda, and sweet tea. Also watch fruit intake, though this is a healthier sweet option, it still contains natural sugar! Limit to 3 servings daily. ? ?Drink at least 1 glass of water with each meal and aim for at least 8 glasses per day ? ?Exercise at least 150 minutes every week.   ?

## 2021-06-15 NOTE — Progress Notes (Signed)
?Cardiology Office Note:   ? ?Date:  06/19/2021  ? ?ID:  Belinda Rice, DOB [birth year], MRN [id number] ? ?PCP:  Tawnya Crook, MD ?  ?[facility] Providers ?Cardiologist:  None { ? ?Referring MD: Tawnya Crook, MD  ? ? ?History of Present Illness:   ? ?Belinda Rice is a 31 y.o. female with no significant past medical history who was referred by Dr. Cherlynn Kaiser for further evaluation of palpitations. ? ?Today, the patient states that in 2019 she began to feel palpitations. Symptoms progressed and she started to have palpitations with associated lightheadedness. She was evaluated by a Cardiologist in [place] where she underwent cardiac monitor which showed that her HR was very elevated throughout the study (unknown rhythm). She was managed conservatively at that time. In March 2020, she was trying to be re-evaluated due to persistent symptoms, however, due to [place] she was unable to be evaluated. She was subsequently seen in [place] at the [facility] clinic and she was told "she was just fine" and no further work-up/treatment was performed. ? ?Today, she continues to have intermittent palpitations with associated lightheadedness. These episodes can occur up to 3-4x/week. Each episode can last minutes to hours. Symptoms are unpredictable and can wake her up at night. She has caffeine but states her symptoms do not usually correlate with caffeine intake. Tries to stay hydrated. Not on any herbal supplements. No alcohol or drug use. ? ?Past Medical History:  ?Diagnosis Date  ? Arthritis   ? History of kidney stones   ? ? ?Past Surgical History:  ?Procedure Laterality Date  ? WISDOM TOOTH EXTRACTION    ? ? ?Current Medications: ?Current Meds  ?Medication Sig  ? DULoxetine (CYMBALTA) 30 MG capsule Take 1 capsule (30 mg total) by mouth daily.  ? etonogestrel-ethinyl estradiol (NUVARING) 0.12-0.015 MG/24HR vaginal ring Place vaginally.  ? meloxicam (MOBIC) 15 MG tablet Take 1 tablet (15 mg total) by mouth daily.  ? omeprazole  (PRILOSEC) 40 MG capsule Take 1 capsule (40 mg total) by mouth daily.  ?  ? ?Allergies:   Sulfites  ? ?Social History  ? ?Socioeconomic History  ? Marital status: Single  ?  Spouse name: Not on file  ? Number of children: 0  ? Years of education: Not on file  ? Highest education level: Not on file  ?Occupational History  ? Not on file  ?Tobacco Use  ? Smoking status: Never  ? Smokeless tobacco: Never  ?Vaping Use  ? Vaping Use: Never used  ?Substance and Sexual Activity  ? Alcohol use: Never  ? Drug use: Never  ? Sexual activity: Yes  ?  Comment: Nuvaring  ?Other Topics Concern  ? Not on file  ?Social History Narrative  ? Part-time [employer]ery  ? 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  ? ?Social Determinants of Health  ? ?Financial Resource Strain: Not on file  ?Food Insecurity: Not on file  ?Transportation Needs: Not on file  ?Physical Activity: Not on file  ?Stress: Not on file  ?Social Connections: Not on file  ?  ? ?Family History: ?The patient's family history includes Brain cancer in her maternal grandmother; Cancer in her maternal grandfather and maternal grandmother; Diabetes in her father; Prostate cancer in her maternal grandfather. ? ?ROS:   ?Please see the history of present illness.    ?Review of Systems  ?Constitutional:  Negative for malaise/fatigue.  ?Respiratory:  Negative for shortness of breath.   ?Cardiovascular:  Positive for palpitations. Negative for chest pain,  orthopnea, claudication, leg swelling and PND.  ?Gastrointestinal:  Negative for blood in stool.  ?Genitourinary:  Negative for hematuria.  ?Musculoskeletal:  Negative for falls.  ?Neurological:  Positive for dizziness. Negative for loss of consciousness.   ? ?EKGs/Labs/Other Studies Reviewed:   ? ?The following studies were reviewed today: ?No cardiac studies ? ?EKG:  EKG is  ordered today.  The ekg ordered today demonstrates NSR with sinus arrhythmia with HR 68 ? ?Recent Labs: ?05/11/2021: ALT 10; BUN 10; Creatinine, Ser  0.67; Hemoglobin 14.6; Platelets 399.0; Potassium 4.1; Sodium 139; TSH 1.30  ?Recent Lipid Panel ?No results found for: CHOL, TRIG, HDL, CHOLHDL, VLDL, LDLCALC, LDLDIRECT ? ? ?Risk Assessment/Calculations:   ?  ? ?    ? ?Physical Exam:   ? ?VS:  BP 114/70   Pulse 68   Ht '5\' 2"'$  (1.575 m)   Wt 168 lb (76.2 kg)   LMP 05/24/2021 (Within Days)   SpO2 98%   BMI 30.73 kg/m?    ? ?Wt Readings from Last 3 Encounters:  ?06/19/21 168 lb (76.2 kg)  ?06/08/21 172 lb 6 oz (78.2 kg)  ?05/11/21 171 lb 4 oz (77.7 kg)  ?  ? ?GEN:  Well nourished, well developed in no acute distress ?HEENT: Normal ?NECK: No JVD; No carotid bruits ?CARDIAC: RRR, no murmurs, rubs, gallops ?RESPIRATORY:  Clear to auscultation without rales, wheezing or rhonchi  ?ABDOMEN: Soft, non-tender, non-distended ?MUSCULOSKELETAL:  No edema; No deformity  ?SKIN: Warm and dry ?NEUROLOGIC:  Alert and oriented x 3 ?PSYCHIATRIC:  Normal affect  ? ?ASSESSMENT:   ? ?1. Palpitations   ? ?PLAN:   ? ?In order of problems listed above: ? ?#Palpitations: ?Patient has had significant palpitations with associated lightheadedness since 2019. Had prior cardiac monitor in 2019 where she was told her HR was "all over the place." She was managed conservatively at that time. Unfortunately, symptoms have persisted and she is having episodes that wake her up at night. Each episode can last minutes to up to an hour. Will check zio monitor to evaluate furtehr ?-Check zio ?-Increase hydration ?-Decrease caffeine ?-PO Mg at night ?-Patient would like to manage conservatively if possible, could consider prn medication pending on what zio shows ? ?   ? ? ?Medication Adjustments/Labs and Tests Ordered: ?Current medicines are reviewed at length with the patient today.  Concerns regarding medicines are outlined above.  ?Orders Placed This Encounter  ?Procedures  ? LONG TERM MONITOR (3-14 DAYS)  ? EKG 12-Lead  ? ?No orders of the defined types were placed in this encounter. ? ? ?Patient  Instructions  ?Medication Instructions:  ? ?Your physician recommends that you continue on your current medications as directed. Please refer to the Current Medication list given to you today. ? ?*If you need a refill on your cardiac medications before your next appointment, please call your pharmacy* ? ? ?Testing/Procedures: ? ?ZIO XT- Long Term Monitor Instructions ? ?Your physician has requested you wear a ZIO patch monitor for 14 days.  ?This is a single patch monitor. [employer] supplies one patch monitor per enrollment. Additional ?stickers are not available. Please do not apply patch if you will be having a Nuclear Stress Test,  ?Echocardiogram, Cardiac CT, MRI, or Chest Xray during the period you would be wearing the  ?monitor. The patch cannot be worn during these tests. You cannot remove and re-apply the  ?ZIO XT patch monitor.  ?Your ZIO patch monitor will be mailed 3 day USPS to your address  on file. It may take 3-5 days  ?to receive your monitor after you have been enrolled.  ?Once you have received your monitor, please review the enclosed instructions. Your monitor  ?has already been registered assigning a specific monitor serial # to you. ? ?Billing and Patient Assistance Program Information ? ?We have supplied [employer] with any of your insurance information on file for billing purposes. ?[employer] offers a sliding scale Patient Assistance Program for patients that do not have  ?insurance, or whose insurance does not completely cover the cost of the ZIO monitor.  ?You must apply for the Patient Assistance Program to qualify for this discounted rate.  ?To apply, please call [employer] at [phone], select option 4, select option 2, ask to apply for  ?Patient Assistance Program. Theodore Demark will ask your household income, and how many people  ?are in your household. They will quote your out-of-pocket cost based on that information.  ?[employer] will also be able to set up a 62-month interest-free payment plan if  needed. ? ?Applying the monitor ?  ?Shave hair from upper left chest.  ?Hold abrader disc by orange tab. Rub abrader in 40 strokes over the upper left chest as  ?indicated in your monitor instructions.  ?Clean area w

## 2021-06-19 ENCOUNTER — Encounter: Payer: Self-pay | Admitting: Cardiology

## 2021-06-19 ENCOUNTER — Ambulatory Visit (INDEPENDENT_AMBULATORY_CARE_PROVIDER_SITE_OTHER): Payer: Commercial Managed Care - PPO

## 2021-06-19 ENCOUNTER — Ambulatory Visit (INDEPENDENT_AMBULATORY_CARE_PROVIDER_SITE_OTHER): Payer: Commercial Managed Care - PPO | Admitting: Cardiology

## 2021-06-19 VITALS — BP 114/70 | HR 68 | Ht 63.0 in | Wt 168.0 lb

## 2021-06-19 DIAGNOSIS — R002 Palpitations: Secondary | ICD-10-CM

## 2021-06-19 NOTE — Progress Notes (Unsigned)
Enrolled patient for a 14 day Zio XT  monitor to be mailed to patients home  °

## 2021-06-19 NOTE — Patient Instructions (Signed)
Medication Instructions:  ? ?Your physician recommends that you continue on your current medications as directed. Please refer to the Current Medication list given to you today. ? ?*If you need a refill on your cardiac medications before your next appointment, please call your pharmacy* ? ? ?Testing/Procedures: ? ?ZIO XT- Long Term Monitor Instructions ? ?Your physician has requested you wear a ZIO patch monitor for 14 days.  ?This is a single patch monitor. [employer] supplies one patch monitor per enrollment. Additional ?stickers are not available. Please do not apply patch if you will be having a Nuclear Stress Test,  ?Echocardiogram, Cardiac CT, MRI, or Chest Xray during the period you would be wearing the  ?monitor. The patch cannot be worn during these tests. You cannot remove and re-apply the  ?ZIO XT patch monitor.  ?Your ZIO patch monitor will be mailed 3 day USPS to your address on file. It may take 3-5 days  ?to receive your monitor after you have been enrolled.  ?Once you have received your monitor, please review the enclosed instructions. Your monitor  ?has already been registered assigning a specific monitor serial # to you. ? ?Billing and Patient Assistance Program Information ? ?We have supplied [employer] with any of your insurance information on file for billing purposes. ?[employer] offers a sliding scale Patient Assistance Program for patients that do not have  ?insurance, or whose insurance does not completely cover the cost of the ZIO monitor.  ?You must apply for the Patient Assistance Program to qualify for this discounted rate.  ?To apply, please call [employer] at [phone], select option 4, select option 2, ask to apply for  ?Patient Assistance Program. Theodore Demark will ask your household income, and how many people  ?are in your household. They will quote your out-of-pocket cost based on that information.  ?[employer] will also be able to set up a 60-month interest-free payment plan if  needed. ? ?Applying the monitor ?  ?Shave hair from upper left chest.  ?Hold abrader disc by orange tab. Rub abrader in 40 strokes over the upper left chest as  ?indicated in your monitor instructions.  ?Clean area with 4 enclosed alcohol pads. Let dry.  ?Apply patch as indicated in monitor instructions. Patch will be placed under collarbone on left  ?side of chest with arrow pointing upward.  ?Rub patch adhesive wings for 2 minutes. Remove white label marked "1". Remove the white  ?label marked "2". Rub patch adhesive wings for 2 additional minutes.  ?While looking in a mirror, press and release button in center of patch. A small green light will  ?flash 3-4 times. This will be your only indicator that the monitor has been turned on.  ?Do not shower for the first 24 hours. You may shower after the first 24 hours.  ?Press the button if you feel a symptom. You will hear a small click. Record Date, Time and  ?Symptom in the Patient Logbook.  ?When you are ready to remove the patch, follow instructions on the last 2 pages of Patient  ?Logbook. Stick patch monitor onto the last page of Patient Logbook.  ?Place Patient Logbook in the blue and white box. Use locking tab on box and tape box closed  ?securely. The blue and white box has prepaid postage on it. Please place it in the mailbox as  ?soon as possible. Your physician should have your test results approximately 7 days after the  ?monitor [facility]  ?Call [employer]  Customer Care at [phone] if you have questions regarding  ?your ZIO XT patch monitor. Call them immediately if you see an orange light blinking on your  ?monitor.  ?If your monitor falls off in less than 4 days, contact our Monitor department at [phone].  ?If your monitor becomes loose or falls off after 4 days call [employer] at [phone] for  ?suggestions on securing your monitor ? ? ? ?Follow-Up: ?At [facility], you and your health needs are our  priority.  As part of our continuing mission to provide you with exceptional heart care, we have created designated Provider Care Teams.  These Care Teams include your primary Cardiologist (physician) and Advanced Practice Providers (APPs -  Physician Assistants and Nurse Practitioners) who all work together to provide you with the care you need, when you need it. ? ?We recommend signing up for the patient portal called "MyChart".  Sign up information is provided on this After Visit Summary.  MyChart is used to connect with patients for Virtual Visits (Telemedicine).  Patients are able to view lab/test results, encounter notes, upcoming appointments, etc.  Non-urgent messages can be sent to your provider as well.   ?To learn more about what you can do with MyChart, go to [email or web address].   ? ?Your next appointment:   ?1 year(s) ? ?The format for your next appointment:   ?In Person ? ?Provider:   ?Dr. Johney Frame  ? ?Important Information About Sugar ? ? ? ? ? ? ?

## 2021-06-20 ENCOUNTER — Encounter (HOSPITAL_BASED_OUTPATIENT_CLINIC_OR_DEPARTMENT_OTHER): Payer: Self-pay | Admitting: Orthopaedic Surgery

## 2021-06-21 ENCOUNTER — Other Ambulatory Visit: Payer: Self-pay | Admitting: Obstetrics and Gynecology

## 2021-06-21 DIAGNOSIS — N63 Unspecified lump in unspecified breast: Secondary | ICD-10-CM

## 2021-06-22 DIAGNOSIS — R002 Palpitations: Secondary | ICD-10-CM

## 2021-07-06 ENCOUNTER — Ambulatory Visit
Admission: RE | Admit: 2021-07-06 | Discharge: 2021-07-06 | Disposition: A | Discharge status: Home / Self Care | Payer: Commercial Managed Care - PPO | Source: Ambulatory Visit | Attending: Obstetrics and Gynecology | Admitting: Obstetrics and Gynecology

## 2021-07-06 DIAGNOSIS — N63 Unspecified lump in unspecified breast: Secondary | ICD-10-CM

## 2021-07-11 ENCOUNTER — Telehealth: Payer: Self-pay | Admitting: Family Medicine

## 2021-07-11 NOTE — Telephone Encounter (Signed)
Spoke to patient and she stated that there was blood in the toilet, none on the tissue. Denies any other sx and stated that it wasn't like it a whole lot of blood, just wanted to inform PCP. Patient was advised to go to ER, patient stated that she hasn't gone because she got busy with school work and doing other stuff. Doesn't know if she will go because it wasn't like she was bleeding profusely. Patient advised that she still need to be checked and if she wasn't going to ER tonight, there was openings for tomorrow morning.

## 2021-07-11 NOTE — Telephone Encounter (Signed)
Patient Name: Belinda Rice Gender: Female DOB: [birth year] Age: 31 Y 51 M 8 D Return Phone Number: [phone] (Primary) Address: City/ State/ Zip: [place]  [address] Client [place] [facility] [place] Client Site [place] [facility] [place] Contact Type Call Who Is Calling Patient / Member / Family / Caregiver Call Type Triage / Clinical Relationship To Patient Self Return Phone Number [phone] (Primary) Chief Complaint SEVERE ABDOMINAL PAIN - Severe pain in abdomen Reason for Call Symptomatic / Request for [place] states she has had abdominal pain for a month, now bleeding uncontrollably. [place] ED Translation No Nurse Assessment Nurse: Rolin Barry, RN, Levada Dy Date/Time Eilene Ghazi Time): 07/11/2021 12:41:12 PM Confirm and document reason for call. If symptomatic, describe symptoms. ---Caller states she has had abdominal pain for a month, now bleeding uncontrollably, bright red blood in stool. Does the patient have any new or worsening symptoms? ---Yes Will a triage be completed? ---Yes Related visit to physician within the last 2 weeks? ---No Does the PT have any chronic conditions? (i.e. diabetes, asthma, this includes High risk factors for pregnancy, etc.) ---No Is the patient pregnant or possibly pregnant? (Ask all females between the ages of 82-55) ---No Is this a behavioral health or substance abuse call? ---No Guidelines Guideline Title Affirmed Question Affirmed Notes Nurse Date/Time (Eastern Time) Rectal Bleeding SEVERE rectal bleeding (large blood clots; constant or on and off bleeding) Deaton, RN, Levada Dy 07/11/2021 12:42:3  Disp. Time Eilene Ghazi Time) Disposition Final User 07/11/2021 12:39:21 PM Send to Urgent Queue [place] Left 07/11/2021 12:45:02 PM Go to ED Now Yes Deaton, RN, Cindee Lame Disagree/Comply Comply Caller  Understands Yes PreDisposition Did not know what to do Care Advice Given Per Guideline GO TO ED NOW: * You need to be seen in the Emergency Department. * Go to the ED at ___________ [place] now. Drive carefully. NOTE TO TRIAGER - DRIVING: * Another adult should drive. * Patient should not delay going to the emergency department. CARE ADVICE given per Rectal Bleeding (Adult) guideline. * If immediate transportation is not available via car, rideshare (e.g., Lyft, Uber), or taxi, then the patient should be instructed to call [id number]. Referrals GO TO FACILITY OTHER - SPECIFY

## 2021-07-11 NOTE — Telephone Encounter (Signed)
No triage note in chart as of 4:05 pm on 07/11/2021.

## 2021-07-11 NOTE — Telephone Encounter (Signed)
Patient called stating she has been having ABD pain for a month - stated she went to bathroom and is now bleeding. Stated it looks like she gave birth. PT had a CT scan and nothing showed on scan -   Patient is being triaged at the moment - awaiting for triage notes.-

## 2021-07-12 NOTE — Telephone Encounter (Signed)
Tried calling patient to follow-up. No answer, voicemail is not set-up.

## 2021-07-13 ENCOUNTER — Encounter: Payer: Self-pay | Admitting: *Deleted

## 2021-07-13 NOTE — Telephone Encounter (Signed)
Sent patient a Pharmacist, community message informing her of message below from PCP. Patient advised to call office with any questions or concerns.

## 2021-07-13 NOTE — Telephone Encounter (Signed)
Tried calling patient to follow-up. No answer, voicemail is not set-up.

## 2021-07-18 ENCOUNTER — Telehealth: Payer: Self-pay | Admitting: *Deleted

## 2021-07-18 DIAGNOSIS — I493 Ventricular premature depolarization: Secondary | ICD-10-CM

## 2021-07-18 MED ORDER — METOPROLOL TARTRATE 25 MG PO TABS: 12.5000 mg | ORAL_TABLET | Freq: Two times a day (BID) | ORAL | 2 refills | Status: DC

## 2021-07-18 NOTE — Telephone Encounter (Signed)
-----   Message from Freada Bergeron, MD sent at 07/18/2021  2:48 PM EDT ----- Her heart monitor shows frequent extra beats called premature ventricular contractions. These correlated with the patient triggered events on the [facility]. They are occurring about 6% of the time. To help evaluate this further, can we get an echo to makes sure the heart pumping function and valves look okay? We can also start metoprolol 12.'5mg'$  BID to try to suppress the extra beats OR wait for the echo to return to see if there is anything abnormal. Usually these are benign and medication is aimed at suppressing symptoms.

## 2021-07-18 NOTE — Telephone Encounter (Signed)
The patient has been notified of the result and verbalized understanding.  All questions (if any) were answered.  Pt aware I will place the order for an echo in the system and send a message to our [facility] Schedulers to call her back and arrange this appt.   Pt states she would like to proceed with starting metoprolol tartrate 12.5 mg po bid.  Confirmed the pharmacy of choice with the pt. Pt verbalized understanding and agrees with this plan.

## 2021-07-24 ENCOUNTER — Encounter: Payer: Self-pay | Admitting: Internal Medicine

## 2021-07-30 ENCOUNTER — Telehealth (HOSPITAL_COMMUNITY): Payer: Self-pay | Admitting: Cardiology

## 2021-07-30 NOTE — Telephone Encounter (Signed)
07/30/21 patient cancelled Echocardiogram due to company cancelled insurance and she will work out and call at a later date. LBW 10:16

## 2021-08-02 ENCOUNTER — Other Ambulatory Visit (HOSPITAL_COMMUNITY): Payer: Commercial Managed Care - PPO

## 2021-08-13 ENCOUNTER — Ambulatory Visit (HOSPITAL_COMMUNITY): Payer: Commercial Managed Care - PPO | Attending: Cardiology

## 2021-08-13 ENCOUNTER — Telehealth: Payer: Self-pay | Admitting: Cardiology

## 2021-08-13 DIAGNOSIS — I340 Nonrheumatic mitral (valve) insufficiency: Secondary | ICD-10-CM | POA: Diagnosis not present

## 2021-08-13 DIAGNOSIS — I361 Nonrheumatic tricuspid (valve) insufficiency: Secondary | ICD-10-CM

## 2021-08-13 DIAGNOSIS — I493 Ventricular premature depolarization: Secondary | ICD-10-CM | POA: Insufficient documentation

## 2021-08-13 LAB — ECHOCARDIOGRAM COMPLETE
Area-P 1/2: 5.16 cm2
S' Lateral: 3.1 cm

## 2021-08-14 MED ORDER — DILTIAZEM HCL 30 MG PO TABS: 30.0000 mg | ORAL_TABLET | Freq: Two times a day (BID) | ORAL | 2 refills | Status: DC

## 2021-08-24 ENCOUNTER — Other Ambulatory Visit (INDEPENDENT_AMBULATORY_CARE_PROVIDER_SITE_OTHER): Payer: Commercial Managed Care - PPO

## 2021-08-24 ENCOUNTER — Ambulatory Visit (INDEPENDENT_AMBULATORY_CARE_PROVIDER_SITE_OTHER): Payer: Commercial Managed Care - PPO | Admitting: Internal Medicine

## 2021-08-24 ENCOUNTER — Encounter: Payer: Self-pay | Admitting: Internal Medicine

## 2021-08-24 VITALS — BP 100/70 | HR 84 | Ht 62.5 in | Wt 171.1 lb

## 2021-08-24 DIAGNOSIS — K625 Hemorrhage of anus and rectum: Secondary | ICD-10-CM

## 2021-08-24 DIAGNOSIS — R1012 Left upper quadrant pain: Secondary | ICD-10-CM | POA: Diagnosis not present

## 2021-08-24 DIAGNOSIS — R1013 Epigastric pain: Secondary | ICD-10-CM | POA: Diagnosis not present

## 2021-08-24 DIAGNOSIS — Z86010 Personal history of colonic polyps: Secondary | ICD-10-CM | POA: Diagnosis not present

## 2021-08-24 LAB — LIPASE: Lipase: 16 U/L (ref 11.0–59.0)

## 2021-08-24 LAB — C-REACTIVE PROTEIN: CRP: <1 mg/dL (ref 0.5–20.0)

## 2021-08-24 LAB — TSH: TSH: 0.84 u[IU]/mL (ref 0.35–5.50)

## 2021-08-24 MED ORDER — NA SULFATE-K SULFATE-MG SULF 17.5-3.13-1.6 GM/177ML PO SOLN: 1.0000 | Freq: Once | ORAL | 0 refills | Status: AC

## 2021-08-24 NOTE — Progress Notes (Signed)
Chief Complaint: LUQ ab pain, rectal bleeding, history of colon polyps  HPI : 31 year old female with history of arthritis and PVCs presents with LUQ ab pain, rectal bleeding, and history of colon polyps  She has had abdominal pain for several years.  This pain tends to occur after she eats. This pain feels like a stabbing sensation that is located in the LUQ. The pain sometimes travels to the lower abdomen. She cannot pinpoint any particular foods that seems to trigger the pain. She eats a plant-based diet and tries to avoid dairy for the most part. She primarily eats fruits and vegetables. Occasionally will have some white rice. Denies NSAID use. Endorses rectal bleeding. A month ago she saw bright red blood in the toilet for about 2 days. Sometimes she has nausea when she exercises. Denies vomiting. Denies chest burning or regurgitation. Denies dysphagia. Denies diarrhea or constipation. Last colonoscopy was at age 57 when she was found to have a colon polyp that had detached. Denies fam hx of GI issues. She will have alcohol use maybe one drink per week.   She is planning to go back to school to get her criminal justice degree and would eventually like to go to law school.   Past Medical History:  Diagnosis Date   Arthritis    History of kidney stones    Past Surgical History:  Procedure Laterality Date   WISDOM TOOTH EXTRACTION     Family History  Problem Relation Age of Onset   Diabetes Father    Brain cancer Maternal Grandmother    Prostate cancer Maternal Grandfather    Anuerysm Paternal Grandmother        brain   Social History   Tobacco Use   Smoking status: Never   Smokeless tobacco: Never  Vaping Use   Vaping Use: Never used  Substance Use Topics   Alcohol use: Never   Drug use: Never   Current Outpatient Medications  Medication Sig Dispense Refill   diltiazem (CARDIZEM) 30 MG tablet Take 1 tablet (30 mg total) by mouth 2 (two) times daily. 60 tablet 2    DULoxetine (CYMBALTA) 30 MG capsule Take 1 capsule (30 mg total) by mouth daily. 90 capsule 1   etonogestrel-ethinyl estradiol (NUVARING) 0.12-0.015 MG/24HR vaginal ring Place vaginally.     meloxicam (MOBIC) 15 MG tablet Take 1 tablet (15 mg total) by mouth daily. 30 tablet 1   omeprazole (PRILOSEC) 40 MG capsule Take 1 capsule (40 mg total) by mouth daily. 90 capsule 1   No current facility-administered medications for this visit.   Allergies  Allergen Reactions   Sulfites Other (See Comments)    Nausea, vomiting, swelling   Metoprolol Tartrate Nausea And Vomiting    Pt reports causes N/V, and bruising to arms and legs   Review of Systems: All systems reviewed and negative except where noted in HPI.   Physical Exam: BP 100/70 (BP Location: Left Arm, Patient Position: Sitting, Cuff Size: Normal)   Pulse 84   Ht 5' 2.5" (1.588 m) Comment: height measured without shoes  Wt 171 lb 2 oz (77.6 kg)   LMP 08/11/2021   BMI 30.80 kg/m  Constitutional: Pleasant,well-developed, female in no acute distress. HEENT: Normocephalic and atraumatic. Conjunctivae are normal. No scleral icterus. Cardiovascular: Normal rate, regular rhythm.  Pulmonary/chest: Effort normal and breath sounds normal. No wheezing, rales or rhonchi. Abdominal: Soft, nondistended, tender in the epigastric area. Bowel sounds active throughout. There are no masses palpable. No  hepatomegaly. Extremities: No edema Neurological: Alert and oriented to person place and time. Skin: Skin is warm and dry. No rashes noted. Psychiatric: Normal mood and affect. Behavior is normal.  Labs 04/2021: CBC, CMP, and TSH nml.   CT A/P w/contrast 05/24/21: IMPRESSION: No acute abnormality within the abdomen or pelvis.  TTE 08/13/21:  1. Left ventricular ejection fraction, by estimation, is 65 to 70%. The  left ventricle has normal function. The left ventricle has no regional  wall motion abnormalities. Left ventricular diastolic parameters  were  normal.   2. Right ventricular systolic function is normal. The right ventricular  size is normal.   3. The mitral valve is normal in structure. Mild mitral valve  regurgitation.   4. The aortic valve is normal in structure. Aortic valve regurgitation is  not visualized.   ASSESSMENT AND PLAN: Epigastric/LUQ ab pain Rectal bleeding History of colon polyp Patient presents with postprandial LUQ/epigastric abdominal pain that has been present for the last few years.  Differential for this abdominal pain includes PUD, GERD, IBS, or biliary colic.  We will plan to start with a basic laboratory evaluation and also perform a right upper quadrant ultrasound and upper endoscopy.  Patient also describes some recent rectal bleeding so we will plan to evaluate this further with the colonoscopy procedure. Her last colonoscopy was over 15 years ago when she was reportedly found to have a colon polyp. - Check lipase, TSH, CRP, TTG IgA, IgA - RUQ U/S - EGD/colonoscopy [facility].  Patient does mention that she has woken up during prior procedures that required sedation.  Christia Reading, MD

## 2021-08-24 NOTE — Patient Instructions (Addendum)
If you are age 32 or older, your body mass index should be between 23-30. Your Body mass index is 30.8 kg/m. If this is out of the aforementioned range listed, please consider follow up with your Primary Care Provider.  If you are age 64 or younger, your body mass index should be between 19-25. Your Body mass index is 30.8 kg/m. If this is out of the aformentioned range listed, please consider follow up with your Primary Care Provider.   ________________________________________________________  The [facility] providers would like to encourage you to use [facility] to communicate with providers for non-urgent requests or questions.  Due to long hold times on the telephone, sending your provider a message by [facility] may be a faster and more efficient way to get a response.  Please allow 48 business hours for a response.  Please remember that this is for non-urgent requests.  _______________________________________________________  Your provider has requested that you go to the basement level for lab work before leaving today. Press "B" on the elevator. The lab is located at the first door on the left as you exit the elevator.  You have been scheduled for an abdominal ultrasound at [facility] (1st floor of hospital) on 08/31/21 at 8:30 am.  Please arrive 30 minutes prior to your appointment for registration. Make certain not to have anything to eat or drink after midnight prior to the procedure. Should you need to reschedule your appointment, please contact radiology at [phone]. This test typically takes about 30 minutes to perform.  You have been scheduled for an endoscopy and colonoscopy. Please follow the written instructions given to you at your visit today. Please pick up your prep supplies at the pharmacy within the next 1-3 days. If you use inhalers (even only as needed), please bring them with you on the day of your procedure.  Due to recent changes in healthcare laws, you may see  the results of your imaging and laboratory studies on MyChart before your provider has had a chance to review them.  We understand that in some cases there may be results that are confusing or concerning to you. Not all laboratory results come back in the same time frame and the provider may be waiting for multiple results in order to interpret others.  Please give [place] 48 hours in order for your provider to thoroughly review all the results before contacting the office for clarification of your results.   Thank you for entrusting me with your care and choosing [facility].  Dr. Lorenso Courier

## 2021-08-25 LAB — TISSUE TRANSGLUTAMINASE, IGA: (tTG) Ab, IgA: <1 U/mL

## 2021-08-25 LAB — IGA: Immunoglobulin A: 238 mg/dL (ref 47–310)

## 2021-08-31 ENCOUNTER — Ambulatory Visit (HOSPITAL_COMMUNITY)
Admission: RE | Admit: 2021-08-31 | Discharge: 2021-08-31 | Disposition: A | Discharge status: Home / Self Care | Payer: Commercial Managed Care - PPO | Source: Ambulatory Visit | Attending: Internal Medicine | Admitting: Internal Medicine

## 2021-08-31 DIAGNOSIS — K625 Hemorrhage of anus and rectum: Secondary | ICD-10-CM | POA: Diagnosis present

## 2021-08-31 DIAGNOSIS — R1012 Left upper quadrant pain: Secondary | ICD-10-CM | POA: Diagnosis present

## 2021-08-31 DIAGNOSIS — Z860100 Personal history of colon polyps, unspecified: Secondary | ICD-10-CM

## 2021-08-31 DIAGNOSIS — R1013 Epigastric pain: Secondary | ICD-10-CM | POA: Diagnosis present

## 2021-10-05 ENCOUNTER — Encounter: Payer: Self-pay | Admitting: Internal Medicine

## 2021-10-12 ENCOUNTER — Encounter: Payer: Self-pay | Admitting: Internal Medicine

## 2021-10-12 ENCOUNTER — Ambulatory Visit (AMBULATORY_SURGERY_CENTER): Payer: Commercial Managed Care - PPO | Admitting: Internal Medicine

## 2021-10-12 VITALS — BP 111/66 | HR 51 | Temp 98.7°F | Resp 12 | Ht 62.0 in | Wt 171.0 lb

## 2021-10-12 DIAGNOSIS — K2960 Other gastritis without bleeding: Secondary | ICD-10-CM

## 2021-10-12 DIAGNOSIS — K625 Hemorrhage of anus and rectum: Secondary | ICD-10-CM | POA: Diagnosis not present

## 2021-10-12 DIAGNOSIS — D124 Benign neoplasm of descending colon: Secondary | ICD-10-CM | POA: Diagnosis not present

## 2021-10-12 DIAGNOSIS — R1013 Epigastric pain: Secondary | ICD-10-CM

## 2021-10-12 DIAGNOSIS — K259 Gastric ulcer, unspecified as acute or chronic, without hemorrhage or perforation: Secondary | ICD-10-CM

## 2021-10-12 DIAGNOSIS — R1012 Left upper quadrant pain: Secondary | ICD-10-CM | POA: Diagnosis not present

## 2021-10-12 DIAGNOSIS — K649 Unspecified hemorrhoids: Secondary | ICD-10-CM

## 2021-10-12 DIAGNOSIS — K3189 Other diseases of stomach and duodenum: Secondary | ICD-10-CM | POA: Diagnosis not present

## 2021-10-12 DIAGNOSIS — Z86010 Personal history of colonic polyps: Secondary | ICD-10-CM

## 2021-10-12 MED ORDER — OMEPRAZOLE 40 MG PO CPDR: 40.0000 mg | DELAYED_RELEASE_CAPSULE | Freq: Two times a day (BID) | ORAL | 2 refills | Status: DC

## 2021-10-12 MED ORDER — SODIUM CHLORIDE 0.9 % IV SOLN: 500.0000 mL | Freq: Once | INTRAVENOUS | Status: DC

## 2021-10-12 MED ORDER — HYDROCORTISONE (PERIANAL) 2.5 % EX CREA: 1.0000 | TOPICAL_CREAM | Freq: Two times a day (BID) | CUTANEOUS | 0 refills | Status: AC

## 2021-10-12 NOTE — Progress Notes (Signed)
Pt in recovery with monitors in place, VSS. Report given to receiving RN. Bite guard was placed with pt awake to ensure comfort. No dental or soft tissue damage noted. 

## 2021-10-12 NOTE — Progress Notes (Signed)
Vitals-CW  Pt's states no medical or surgical changes since previsit or office visit.   Patient states that her veins roll.

## 2021-10-12 NOTE — Op Note (Signed)
[facility] Patient Name: Belinda Rice Procedure Date: 10/12/2021 2:19 PM MRN: [id number] Endoscopist: Sonny Masters "Belinda Rice ,  Age: 31 Referring MD:  Date of Birth: 1990/10/14 Gender: Female Account #: [id number] Procedure:                Upper GI endoscopy Indications:              Epigastric abdominal pain, Abdominal pain in the                            left upper quadrant Medicines:                Monitored Anesthesia Care Procedure:                Pre-Anesthesia Assessment:                           - Prior to the procedure, a History and Physical                            was performed, and patient medications and                            allergies were reviewed. The patient's tolerance of                            previous anesthesia was also reviewed. The risks                            and benefits of the procedure and the sedation                            options and risks were discussed with the patient.                            All questions were answered, and informed consent                            was obtained. Prior Anticoagulants: The patient has                            taken no previous anticoagulant or antiplatelet                            agents. ASA Grade Assessment: II - A patient with                            mild systemic disease. After reviewing the risks                            and benefits, the patient was deemed in                            satisfactory condition to undergo the procedure.  After obtaining informed consent, the endoscope was                            passed under direct vision. Throughout the                            procedure, the patient's blood pressure, pulse, and                            oxygen saturations were monitored continuously. The                            Endoscope was introduced through the mouth, and                            advanced to the second part of  duodenum. The upper                            GI endoscopy was accomplished without difficulty.                            The patient tolerated the procedure well. Scope In: Scope Out: Findings:                 The examined esophagus was normal. Biopsies were                            taken with a cold forceps for histology.                           Localized inflammation characterized by congestion                            (edema), erosions, erythema and friability was                            found in the gastric fundus and in the gastric                            body. Biopsies were taken with a cold forceps for                            histology.                           The examined duodenum was normal. Biopsies were                            taken with a cold forceps for histology. Complications:            No immediate complications. Estimated Blood Loss:     Estimated blood loss was minimal. Impression:               - Normal esophagus. Biopsied.                           -  Gastritis. Biopsied.                           - Normal examined duodenum. Biopsied. Recommendation:           - Await pathology results.                           - No ibuprofen, naproxen, or other non-steroidal                            anti-inflammatory drugs.                           - Use Prilosec (omeprazole) 40 mg PO BID for 8                            weeks.                           - Perform a colonoscopy today. Dr Georgian Co "[name]" Godley,  10/12/2021 3:01:20 PM

## 2021-10-12 NOTE — Patient Instructions (Addendum)
Omeprazole 40 mg by mouth twice a day for 8 weeks   Stomach & esophageal biopsies done - await pathology results  No Ibuprofen,Naproxen,or other non steroidal anti inflammatory drugs (see below)  Handout on polyps & hemorrhoids given to you   Await pathology results on polyp removed   Anusol HC cream twice a day for 7 days - to rectal area for hemorrhoids  Make appointment to see Dr Lorenso Courier back in clinic in 6 weeks    YOU HAD AN ENDOSCOPIC PROCEDURE TODAY AT [place]:   Refer to the procedure report that was given to you for any specific questions about what was found during the examination.  If the procedure report does not answer your questions, please call your gastroenterologist to clarify.  If you requested that your care partner not be given the details of your procedure findings, then the procedure report has been included in a sealed envelope for you to review at your convenience later.  YOU SHOULD EXPECT: Some feelings of bloating in the abdomen. Passage of more gas than usual.  Walking can help get rid of the air that was put into your GI tract during the procedure and reduce the bloating. If you had a lower endoscopy (such as a colonoscopy or flexible sigmoidoscopy) you may notice spotting of blood in your stool or on the toilet paper. If you underwent a bowel prep for your procedure, you may not have a normal bowel movement for a few days.  Please Note:  You might notice some irritation and congestion in your nose or some drainage.  This is from the oxygen used during your procedure.  There is no need for concern and it should clear up in a day or so.  SYMPTOMS TO REPORT IMMEDIATELY:  Following lower endoscopy (colonoscopy or flexible sigmoidoscopy):  Excessive amounts of blood in the stool  Significant tenderness or worsening of abdominal pains  Swelling of the abdomen that is new, acute  Fever of 100F or higher  Following upper endoscopy  (EGD)  Vomiting of blood or coffee ground material  New chest pain or pain under the shoulder blades  Painful or persistently difficult swallowing  New shortness of breath  Fever of 100F or higher  Black, tarry-looking stools  For urgent or emergent issues, a gastroenterologist can be reached at any hour by calling [phone]. Do not use MyChart messaging for urgent concerns.    DIET:  We do recommend a small meal at first, but then you may proceed to your regular diet.  Drink plenty of fluids but you should avoid alcoholic beverages for 24 hours.  ACTIVITY:  You should plan to take it easy for the rest of today and you should NOT DRIVE or use heavy machinery until tomorrow (because of the sedation medicines used during the test).    FOLLOW UP: Our staff will call the number listed on your records the next business day following your procedure.  We will call around 7:15- 8:00 am to check on you and address any questions or concerns that you may have regarding the information given to you following your procedure. If we do not reach you, we will leave a message.  If you develop any symptoms (ie: fever, flu-like symptoms, shortness of breath, cough etc.) before then, please call [phone].  If you test positive for Covid 19 in the 2 weeks post procedure, please call and report this information to [place].    If any biopsies were  taken you will be contacted by phone or by letter within the next 1-3 weeks.  Please call us at [phone] if you have not heard about the biopsies in 3 weeks.    SIGNATURES/CONFIDENTIALITY: You and/or your care partner have signed paperwork which will be entered into your electronic medical record.  These signatures attest to the fact that that the information above on your After Visit Summary has been reviewed and is understood.  Full responsibility of the confidentiality of this discharge information lies with you and/or your care-partner.

## 2021-10-12 NOTE — Progress Notes (Signed)
Called to room to assist during endoscopic procedure.  Patient ID and intended procedure confirmed with present staff. Received instructions for my participation in the procedure from the performing physician.  

## 2021-10-12 NOTE — Progress Notes (Signed)
GASTROENTEROLOGY PROCEDURE H&P NOTE   Primary Care Physician: Tawnya Crook, MD    Reason for Procedure:   LUQ/epigastric ab pain, rectal bleeding, history of colon polyp  Plan:    EGD/colonoscopy  Patient is appropriate for endoscopic procedure(s) in the ambulatory ([facility]) setting.  The nature of the procedure, as well as the risks, benefits, and alternatives were carefully and thoroughly reviewed with the patient. Ample time for discussion and questions allowed. The patient understood, was satisfied, and agreed to proceed.     HPI: Belinda Rice is a 31 y.o. female who presents for EGD/colonoscopy for evaluation of LUQ/epigastric ab pain, rectal bleeding, and history of colon polyp .  Patient was most recently seen in the Gastroenterology Clinic on 08/24/21.  No interval change in medical history since that appointment. Please refer to that note for full details regarding GI history and clinical presentation.   Past Medical History:  Diagnosis Date   Arthritis    History of kidney stones     Past Surgical History:  Procedure Laterality Date   WISDOM TOOTH EXTRACTION      Prior to Admission medications   Medication Sig Start Date End Date Taking? Authorizing Provider  diltiazem (CARDIZEM) 30 MG tablet Take 1 tablet (30 mg total) by mouth 2 (two) times daily. 08/14/21  Yes Freada Bergeron, MD  DULoxetine (CYMBALTA) 30 MG capsule Take 1 capsule (30 mg total) by mouth daily. 06/08/21  Yes Tawnya Crook, MD    Current Outpatient Medications  Medication Sig Dispense Refill   diltiazem (CARDIZEM) 30 MG tablet Take 1 tablet (30 mg total) by mouth 2 (two) times daily. 60 tablet 2   DULoxetine (CYMBALTA) 30 MG capsule Take 1 capsule (30 mg total) by mouth daily. 90 capsule 1   Current Facility-Administered Medications  Medication Dose Route Frequency Provider Last Rate Last Admin   0.9 %  sodium chloride infusion  500 mL Intravenous Once Sharyn Creamer, MD         Allergies as of 10/12/2021 - Review Complete 10/12/2021  Allergen Reaction Noted   Sulfites Other (See Comments) 05/11/2021   Metoprolol tartrate Nausea And Vomiting 08/14/2021    Family History  Problem Relation Age of Onset   Diabetes Father    Brain cancer Maternal Grandmother    Prostate cancer Maternal Grandfather    Anuerysm Paternal Grandmother        brain    Social History   Socioeconomic History   Marital status: Single    Spouse name: Not on file   Number of children: 0   Years of education: Not on file   Highest education level: Not on file  Occupational History   Occupation: student  Tobacco Use   Smoking status: Never   Smokeless tobacco: Never  Vaping Use   Vaping Use: Never used  Substance and Sexual Activity   Alcohol use: Never   Drug use: Never   Sexual activity: Yes    Comment: Nuvaring  Other Topics Concern   Not on file  Social History Narrative   Part-time [employer]   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   Social Determinants of Health   Financial Resource Strain: Not on file  Food Insecurity: Not on file  Transportation Needs: Not on file  Physical Activity: Not on file  Stress: Not on file  Social Connections: Not on file  Intimate Partner Violence: Not on file    Physical Exam: Vital signs in last  24 hours: BP (!) 118/53 (BP Location: Right Arm, Patient Position: Sitting, Cuff Size: Normal)   Pulse 76   Temp 98.7 F (37.1 C) (Temporal)   Ht '5\' 2"'$  (1.575 m)   Wt 171 lb (77.6 kg)   LMP 10/03/2021 Comment: Negative pregancy  test  SpO2 100%   BMI 31.28 kg/m  GEN: NAD EYE: Sclerae anicteric ENT: MMM CV: Non-tachycardic Pulm: No increased WOB GI: Soft NEURO:  Alert & Oriented   Christia Reading, MD [facility]   10/12/2021 2:05 PM

## 2021-10-12 NOTE — Op Note (Signed)
[facility] Patient Name: Belinda Rice Procedure Date: 10/12/2021 2:19 PM MRN: [id number] Endoscopist: Sonny Masters "Belinda Rice ,  Age: 31 Referring MD:  Date of Birth: 21-Feb-1990 Gender: Female Account #: [id number] Procedure:                Colonoscopy Indications:              Rectal bleeding Medicines:                Monitored Anesthesia Care Procedure:                Pre-Anesthesia Assessment:                           - Prior to the procedure, a History and Physical                            was performed, and patient medications and                            allergies were reviewed. The patient's tolerance of                            previous anesthesia was also reviewed. The risks                            and benefits of the procedure and the sedation                            options and risks were discussed with the patient.                            All questions were answered, and informed consent                            was obtained. Prior Anticoagulants: The patient has                            taken no previous anticoagulant or antiplatelet                            agents. ASA Grade Assessment: II - A patient with                            mild systemic disease. After reviewing the risks                            and benefits, the patient was deemed in                            satisfactory condition to undergo the procedure.                           After obtaining informed consent, the colonoscope  was passed under direct vision. Throughout the                            procedure, the patient's blood pressure, pulse, and                            oxygen saturations were monitored continuously. The                            Colonoscope was introduced through the anus and                            advanced to the the terminal ileum. The colonoscopy                            was performed without difficulty. The  patient                            tolerated the procedure well. The quality of the                            bowel preparation was good. The terminal ileum,                            ileocecal valve, appendiceal orifice, and rectum                            were photographed. Scope In: 2:35:02 PM Scope Out: 2:55:19 PM Scope Withdrawal Time: 0 hours 17 minutes 42 seconds  Total Procedure Duration: 0 hours 20 minutes 17 seconds  Findings:                 The terminal ileum appeared normal.                           A 4 mm polyp was found in the descending colon. The                            polyp was sessile. The polyp was removed with a                            cold snare. Resection and retrieval were complete.                           Biopsies were taken with a cold forceps in the                            entire colon for histology.                           Non-bleeding internal hemorrhoids were found during                            retroflexion. Complications:  No immediate complications. Estimated Blood Loss:     Estimated blood loss was minimal. Impression:               - The examined portion of the ileum was normal.                           - One 4 mm polyp in the descending colon, removed                            with a cold snare. Resected and retrieved.                           - Non-bleeding internal hemorrhoids.                           - Biopsies were taken with a cold forceps for                            histology in the entire colon. Recommendation:           - Discharge patient to home (with escort).                           - Await pathology results.                           - Anusol HC cream BID for 7 days.                           - Return to GI clinic in 6 weeks.                           - The findings and recommendations were discussed                            with the patient. Dr Georgian Co "Belinda Rice" Belinda Rice,  10/12/2021  3:04:53 PM

## 2021-10-15 ENCOUNTER — Telehealth: Payer: Self-pay

## 2021-10-15 NOTE — Telephone Encounter (Signed)
Attempted to reach pt. With follow-up call following endoscopic procedure 10/12/2021.  LM On pt. Voice mail to call if she has any questions or concerns.

## 2021-10-17 ENCOUNTER — Encounter: Payer: Self-pay | Admitting: Internal Medicine

## 2021-10-19 ENCOUNTER — Telehealth: Payer: Self-pay | Admitting: Cardiology

## 2021-10-19 MED ORDER — DILTIAZEM HCL ER COATED BEADS 120 MG PO CP24: 120.0000 mg | ORAL_CAPSULE | Freq: Every day | ORAL | 2 refills | Status: DC

## 2021-10-19 NOTE — Telephone Encounter (Signed)
As copied below from telephone encounter on 6/26 with the pt, Dr. Johney Frame started her on diltiazem 30 mg po bid and advised her to call us back if she was tolerating this appropriately, for we could then switch her to the once a day long acting version thereafter.   Pt states she is doing very well on the short acting dilt and is interested in switching to the long-acting version, as advised on 6/26.    Freada Bergeron, MD   HP   08/13/21  8:06 PM Note We can try switching her to diltiazem '30mg'$  BID. If she tolerates this, we can put her on a once a day long acting form.      Spoke with Dr. Johney Frame about this and we will switch her to long acting Cardizem CD 120 mg po daily.  Endorsed this to the pt.  Confirmed the pharmacy of choice with the pt.  Pt verbalized understanding and agrees with this plan.

## 2021-10-19 NOTE — Telephone Encounter (Signed)
Pt c/o medication issue:  1. Name of Medication: diltiazem (CARDIZEM) 30 MG tablet  2. How are you currently taking this medication (dosage and times per day)? As prescribed    3. Are you having a reaction (difficulty breathing--STAT)? No  4. What is your medication issue? Pt states that she is calling to state that this medication is doing very well for her and that she has been able to run each day up to 4 miles with her HR only getting up to 200 BPM and that she would like a call back to discuss the long acting medication that was suggested to her. Please advise.

## 2021-10-23 ENCOUNTER — Other Ambulatory Visit: Payer: Self-pay

## 2021-10-23 ENCOUNTER — Telehealth: Payer: Self-pay | Admitting: Internal Medicine

## 2021-10-23 MED ORDER — PANTOPRAZOLE SODIUM 40 MG PO TBEC: 40.0000 mg | DELAYED_RELEASE_TABLET | Freq: Two times a day (BID) | ORAL | 2 refills | Status: AC

## 2021-10-23 NOTE — Telephone Encounter (Signed)
Pt called and states she is nausous when taking her omeprazole, she has stomach pain as well. Pt would like a call back to further advice, thank you.

## 2021-10-23 NOTE — Telephone Encounter (Signed)
Called to advise. No answer. Left message on the voicemail. [facility] [place].

## 2021-10-23 NOTE — Telephone Encounter (Signed)
Spoke with the patient. She reports instances of nausea and abdominal pain. Her daily routine has not deviated from how she did it prior to her EGD and starting of Omeprazole. The patient works out in the mornings; she runs 4 miles in under an hour, and has been experiencing nausea 10 minutes into her run. She does not eat prior to her workout routine; she will return home, take her Omeprazole and eat 30 minutes later. She has refluxed after a sneeze when she was on her way home from the gym. She has also noted a chest tightness (breathlessness) occurring during her workout which is new.  Please advise.

## 2021-11-12 ENCOUNTER — Encounter: Payer: Self-pay | Admitting: *Deleted

## 2021-12-07 ENCOUNTER — Ambulatory Visit: Payer: Commercial Managed Care - PPO | Admitting: Family Medicine

## 2022-01-31 ENCOUNTER — Encounter: Payer: Self-pay | Admitting: *Deleted

## 2022-07-16 ENCOUNTER — Other Ambulatory Visit: Payer: Self-pay

## 2022-07-16 MED ORDER — DILTIAZEM HCL ER COATED BEADS 120 MG PO CP24: 120.0000 mg | ORAL_CAPSULE | Freq: Every day | ORAL | 0 refills | Status: DC

## 2022-08-14 ENCOUNTER — Other Ambulatory Visit: Payer: Self-pay | Admitting: *Deleted

## 2022-08-14 MED ORDER — DILTIAZEM HCL ER COATED BEADS 120 MG PO CP24: 120.0000 mg | ORAL_CAPSULE | Freq: Every day | ORAL | 0 refills | Status: DC

## 2022-08-27 ENCOUNTER — Other Ambulatory Visit: Payer: Self-pay

## 2022-08-27 MED ORDER — DILTIAZEM HCL ER COATED BEADS 120 MG PO CP24: 120.0000 mg | ORAL_CAPSULE | Freq: Every day | ORAL | 0 refills | Status: DC

## 2022-09-09 ENCOUNTER — Other Ambulatory Visit: Payer: Self-pay | Admitting: *Deleted

## 2022-09-13 ENCOUNTER — Telehealth: Payer: Self-pay | Admitting: Cardiology

## 2022-09-13 MED ORDER — DILTIAZEM HCL ER COATED BEADS 120 MG PO CP24: 120.0000 mg | ORAL_CAPSULE | Freq: Every day | ORAL | 3 refills | Status: DC

## 2022-09-13 NOTE — Telephone Encounter (Signed)
Patient is requesting message be sent to Dr. Shari Prows letting her know that she is very grateful for the help she has given her with the medication she started patient on. She states that it has changed her life for the better and can run up to 4 miles now with no issues. She wanted to add that she is sad to see her go and hopes her next venture will be a great one.

## 2022-09-13 NOTE — Telephone Encounter (Signed)
Fullman, Hillery Aldo E - 09/13/2022  2:35 PM Meriam Sprague, MD  Sent: Caleen Essex September 13, 2022  2:52 PM  To: Loa Socks, LPN         Message  That is so so nice! Thank you so much!!

## 2022-09-13 NOTE — Telephone Encounter (Signed)
Pt's medication was sent to pt's pharmacy as requested. Confirmation received.  °

## 2022-09-13 NOTE — Telephone Encounter (Signed)
*  STAT* If patient is at the pharmacy, call can be transferred to refill team.   1. Which medications need to be refilled? (please list name of each medication and dose if known) diltiazem (CARDIZEM CD) 120 MG 24 hr capsule   2. Which pharmacy/location (including street and city if local pharmacy) is medication to be sent to?  [id number] [place], [place] - [address]    3. Do they need a 30 day or 90 day supply? 30   Has appt scheduled for future appt (waiting for insurance)

## 2023-01-06 ENCOUNTER — Ambulatory Visit (HOSPITAL_BASED_OUTPATIENT_CLINIC_OR_DEPARTMENT_OTHER): Payer: Commercial Managed Care - PPO | Admitting: Cardiology

## 2023-01-10 ENCOUNTER — Telehealth: Payer: Self-pay | Admitting: Cardiology

## 2023-01-10 MED ORDER — DILTIAZEM HCL ER COATED BEADS 120 MG PO CP24: 120.0000 mg | ORAL_CAPSULE | Freq: Every day | ORAL | 0 refills | Status: DC

## 2023-01-10 NOTE — Telephone Encounter (Signed)
*  STAT* If patient is at the pharmacy, call can be transferred to refill team.   1. Which medications need to be refilled? (please list name of each medication and dose if known)   diltiazem (CARDIZEM CD) 120 MG 24 hr capsule   2. Which pharmacy/location (including street and city if local pharmacy) is medication to be sent to? [id number] [place], [place] - [address] Phone: [phone]  Fax: [phone]   3. Do they need a 30 day or 90 day supply? 90  Pt only has 3 pills left

## 2023-01-13 ENCOUNTER — Other Ambulatory Visit: Payer: Self-pay

## 2023-02-06 ENCOUNTER — Other Ambulatory Visit: Payer: Self-pay | Admitting: Cardiology

## 2023-02-06 MED ORDER — DILTIAZEM HCL ER COATED BEADS 120 MG PO CP24: 120.0000 mg | ORAL_CAPSULE | Freq: Every day | ORAL | 0 refills | Status: DC

## 2023-03-04 ENCOUNTER — Encounter (HOSPITAL_BASED_OUTPATIENT_CLINIC_OR_DEPARTMENT_OTHER): Payer: Self-pay | Admitting: Cardiology

## 2023-03-04 ENCOUNTER — Other Ambulatory Visit (HOSPITAL_BASED_OUTPATIENT_CLINIC_OR_DEPARTMENT_OTHER): Payer: Self-pay

## 2023-03-04 ENCOUNTER — Ambulatory Visit (INDEPENDENT_AMBULATORY_CARE_PROVIDER_SITE_OTHER): Payer: Self-pay | Admitting: Cardiology

## 2023-03-04 VITALS — BP 96/82 | HR 88 | Ht 62.0 in

## 2023-03-04 DIAGNOSIS — I493 Ventricular premature depolarization: Secondary | ICD-10-CM

## 2023-03-04 DIAGNOSIS — R002 Palpitations: Secondary | ICD-10-CM

## 2023-03-04 DIAGNOSIS — R0789 Other chest pain: Secondary | ICD-10-CM

## 2023-03-04 DIAGNOSIS — R42 Dizziness and giddiness: Secondary | ICD-10-CM

## 2023-03-04 NOTE — Patient Instructions (Signed)
 Medication Instructions:  Your physician recommends that you continue on your current medications as directed. Please refer to the Current Medication list given to you today.  *If you need a refill on your cardiac medications before your next appointment, please call your pharmacy*  Testing/Procedures: Your physician has recommended that you wear a Zio monitor.   This monitor is a medical device that records the heart's electrical activity. Doctors most often use these monitors to diagnose arrhythmias. Arrhythmias are problems with the speed or rhythm of the heartbeat. The monitor is a small device applied to your chest. You can wear one while you do your normal daily activities. While wearing this monitor if you have any symptoms to push the button and record what you felt. Once you have worn this monitor for the period of time provider prescribed (Usually 14 days), you will return the monitor device in the postage paid box. Once it is returned they will download the data collected and provide us  with a report which the provider will then review and we will call you with those results. Important tips:  Avoid showering during the first 24 hours of wearing the monitor. Avoid excessive sweating to help maximize wear time. Do not submerge the device, no hot tubs, and no swimming pools. Keep any lotions or oils away from the patch. After 24 hours you may shower with the patch on. Take brief showers with your back facing the shower head.  Do not remove patch once it has been placed because that will interrupt data and decrease adhesive wear time. Push the button when you have any symptoms and write down what you were feeling. Once you have completed wearing your monitor, remove and place into box which has postage paid and place in your outgoing mailbox.  If for some reason you have misplaced your box then call our office and we can provide another box and/or mail it off for you.   Follow-Up: At  [facility], you and your health needs are our priority.  As part of our continuing mission to provide you with exceptional heart care, we have created designated Provider Care Teams.  These Care Teams include your primary Cardiologist (physician) and Advanced Practice Providers (APPs -  Physician Assistants and Nurse Practitioners) who all work together to provide you with the care you need, when you need it.  We recommend signing up for the patient portal called MyChart.  Sign up information is provided on this After Visit Summary.  MyChart is used to connect with patients for Virtual Visits (Telemedicine).  Patients are able to view lab/test results, encounter notes, upcoming appointments, etc.  Non-urgent messages can be sent to your provider as well.   To learn more about what you can do with MyChart, go to [email or web address].    Your next appointment:   April 08, 2023 @ 9:40  Provider:   Shelda Bruckner, MD    Other Instructions    '

## 2023-03-04 NOTE — Progress Notes (Signed)
 Cardiology Office Note:  .   Date:  03/04/2023  ID:  Belinda Rice, DOB 03-10-1990, MRN [id number] PCP: Wendolyn Jenkins Jansky, Belinda Rice  [facility] Providers Cardiologist:  Shelda Bruckner, Belinda Rice {  History of Present Illness: .   Belinda Rice is a 33 y.o. 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 She was previously seen by Dr. Hobart on 06/19/21.  Pertinent CV history: 02-04-22 echo normal. 2022-02-04 monitor with 6.2% PVCs. Did not tolerate metoprolol . Started on diltiazem .  Today: Here with her mother today. Feels that she is not doing well. We reviewed her history/management of PVCS. First medication was metoprolol --had bad bruising, shortness of breath. Then changed to diltiazem  and felt great, went back to the gym, able to run. Then about a year ago, started feeling more fatigue, stabbing sharp chest pains similar to her initial symptoms. Gets lightheaded and fatigued currently.   She doesn't feel the PVCs as much as she doesn't feel well.   Had a rash with prior monitor, was itching. Willing to try monitor again.  Saw an EP in 02/05/2019 in [place] at [facility] (Dr. Thelma), was being monitored for her palpitations but no treatment at that time.  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 Father passed away in 2017/02/04. Mother has a benign murmur. Brother died of cerebral aneurysm, pat grandmother also with cerebral aneurysm.  Per patient, diagnosed with EDS by Dr. Genelle. Notes from February 04, 2022 reviewed.  ROS: Denies chest pain, shortness of breath at rest. No PND, orthopnea, LE edema or unexpected weight gain. No syncope or palpitations. ROS otherwise negative except as noted.   Studies Reviewed: SABRA    EKG:  EKG Interpretation Date/Time:  Tuesday March 04 2023 08:45:55 EST Ventricular Rate:  89 PR Interval:  148 QRS Duration:  72 QT Interval:  362 QTC Calculation: 440 R Axis:   61  Text Interpretation: Sinus  rhythm with occasional Premature ventricular complexes Confirmed by Bruckner Shelda [phone]) on 03/04/2023 8:55:51 AM    Physical Exam:   VS:  BP 96/82   Pulse 88   Ht 5' 2 (1.575 m)   SpO2 96%   BMI 31.28 kg/m    Wt Readings from Last 3 Encounters:  10/12/21 171 lb (77.6 kg)  08/24/21 171 lb 2 oz (77.6 kg)  06/19/21 168 lb (76.2 kg)    GEN: Well nourished, well developed in no acute distress HEENT: Normal, moist mucous membranes NECK: No JVD CARDIAC: regular rhythm with premature beats, normal S1 and S2, no rubs or gallops. No murmur. VASCULAR: Radial and DP pulses 2+ bilaterally. No carotid bruits RESPIRATORY:  Clear to auscultation without rales, wheezing or rhonchi  ABDOMEN: Soft, non-tender, non-distended MUSCULOSKELETAL:  Ambulates independently SKIN: Warm and dry, no edema NEUROLOGIC:  Alert and oriented x 3. No focal neuro deficits noted. PSYCHIATRIC:  Normal affect    ASSESSMENT AND PLAN: .    Palpitations PVCs Atypical chest pain Intermittent lightheadedness -will repeat 7 day monitor to assess PVC burden -given low blood pressure, no room to increase diltiazem  at this time -if PVC burden similar or greater to prior, would refer to EP to discuss antiarrhythmics given her symptoms. Discussed that ablation would be absolute last resort. -reviewed red flag warning signs that need immediate medical attention -discussed that if monitor does not show increase in PVC burden, then may need to do further general assessment for lightheadedness/fatigue  Dispo: 6-8  weeks to review monitor  Signed, Shelda Bruckner, Belinda Rice   Shelda Bruckner, MD, PhD, [facility]  [facility]  [place]  Heart & Vascular at [facility] at [facility] [address] [place], [place] [address] [phone]

## 2023-03-13 ENCOUNTER — Other Ambulatory Visit: Payer: Self-pay | Admitting: Cardiology

## 2023-03-17 ENCOUNTER — Other Ambulatory Visit: Payer: Self-pay

## 2023-03-17 MED ORDER — DILTIAZEM HCL ER COATED BEADS 120 MG PO CP24: 120.0000 mg | ORAL_CAPSULE | Freq: Every day | ORAL | 3 refills | Status: DC

## 2023-04-04 IMAGING — MG DIGITAL DIAGNOSTIC BILAT W/ TOMO W/ CAD
8 series · 8 of 24 positions shown · non-contrast
Comparison: None available.

CLINICAL DATA: 30-year-old female with focal intermittent pain in
the lateral left breast.

EXAM:
DIGITAL DIAGNOSTIC BILATERAL MAMMOGRAM WITH TOMOSYNTHESIS AND CAD;
ULTRASOUND LEFT BREAST LIMITED
TECHNIQUE: Bilateral digital diagnostic mammography and breast tomosynthesis
was performed. The images were evaluated with computer-aided
detection.; Targeted ultrasound examination of the left breast was
performed.

[L CC synth-2D]
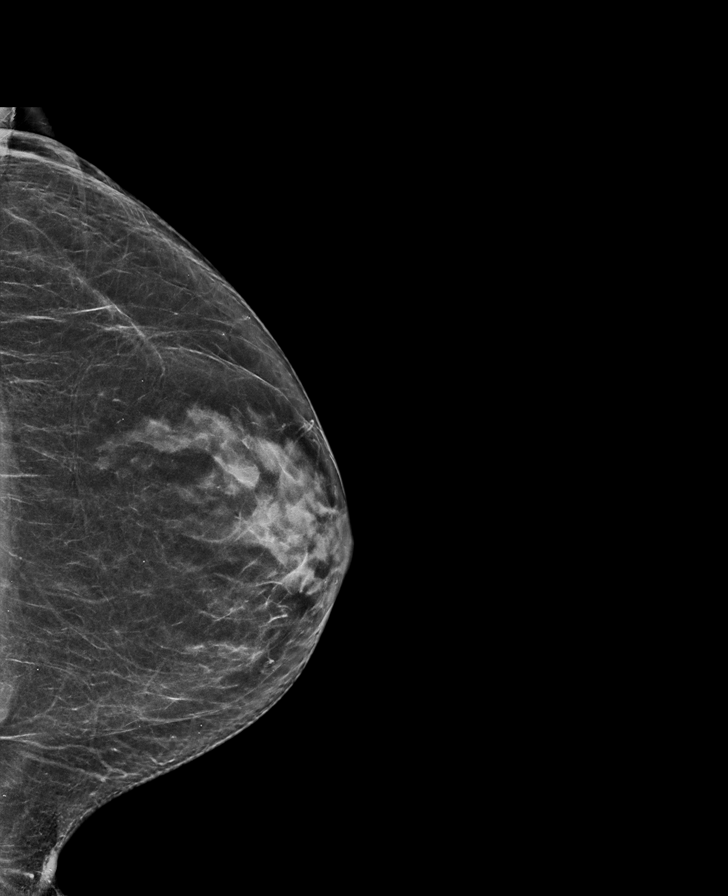

[R MLO synth-2D]
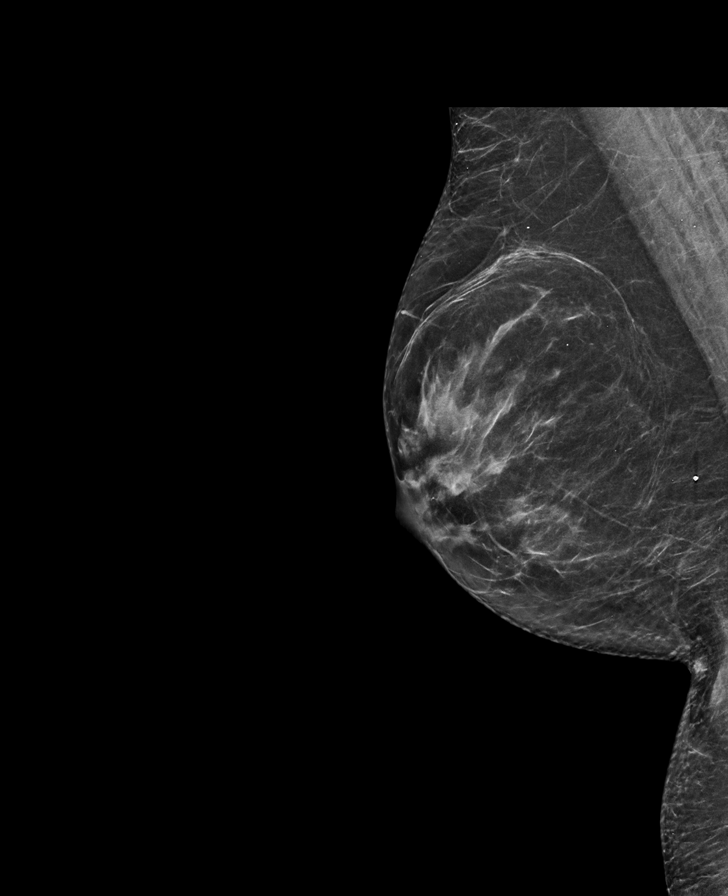

[L MLO synth-2D]
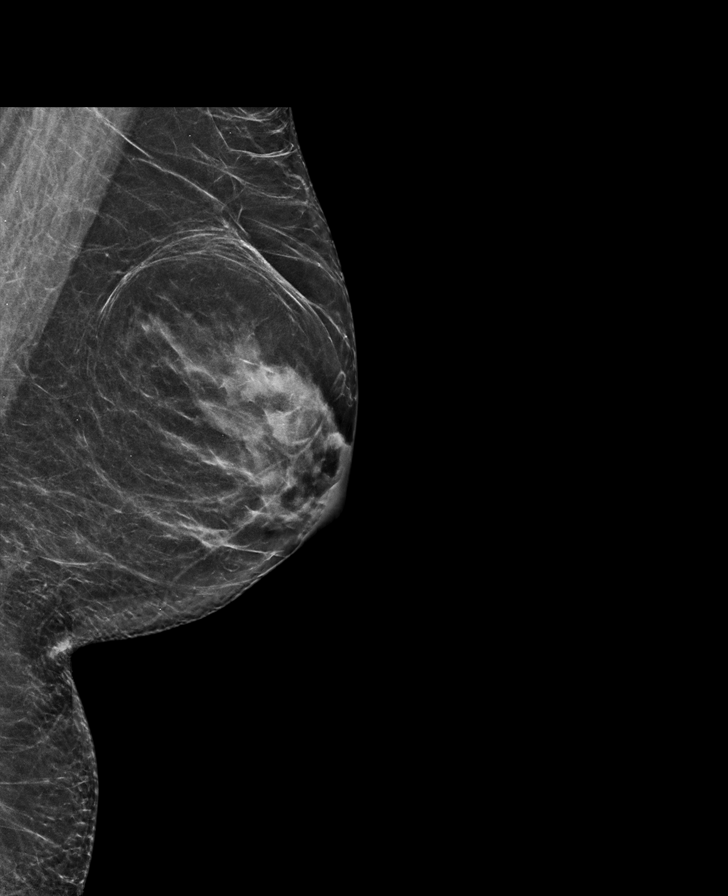

[R CC synth-2D]
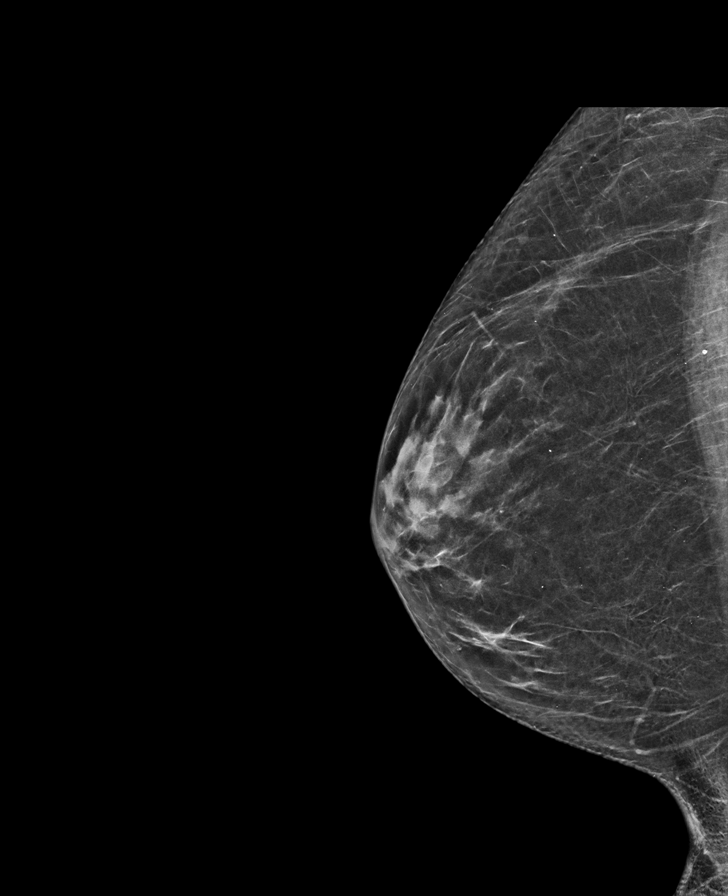

[L CC tomo · tomo slice 35/69.0]
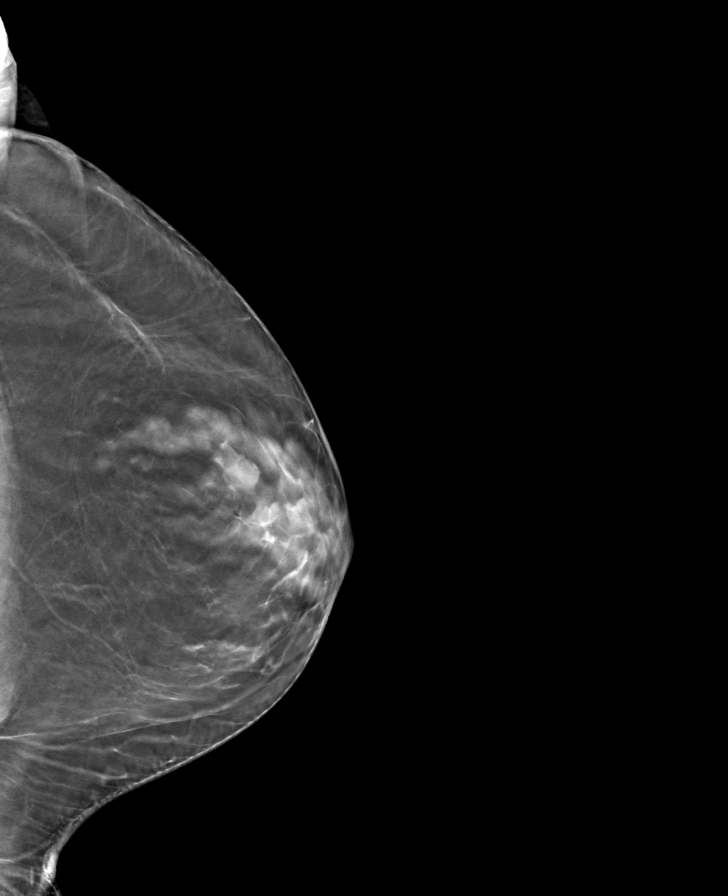

[R MLO tomo · tomo slice 34/67.0]
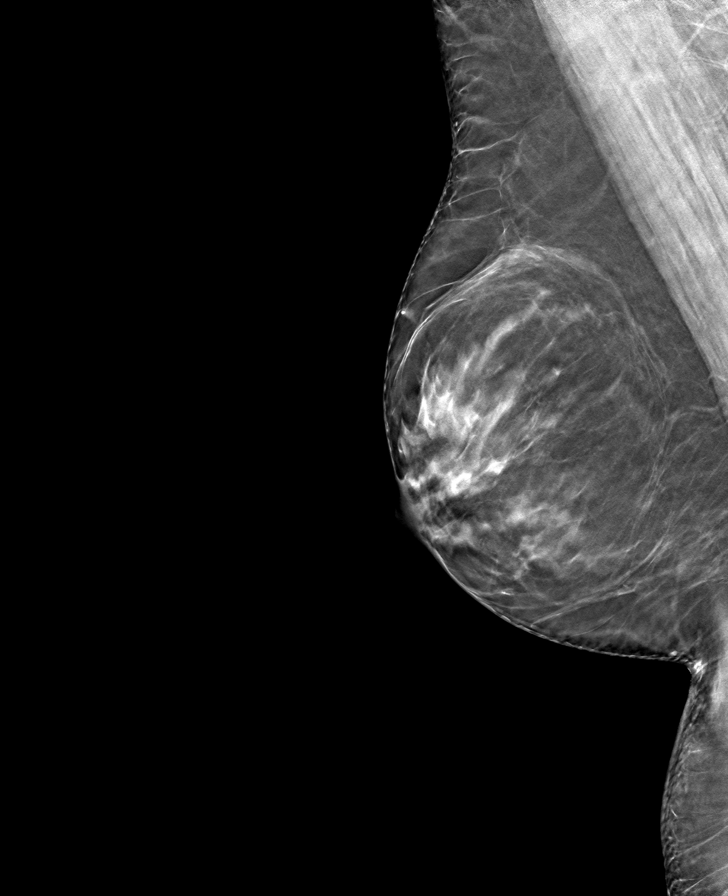

[R CC tomo · tomo slice 34/67.0]
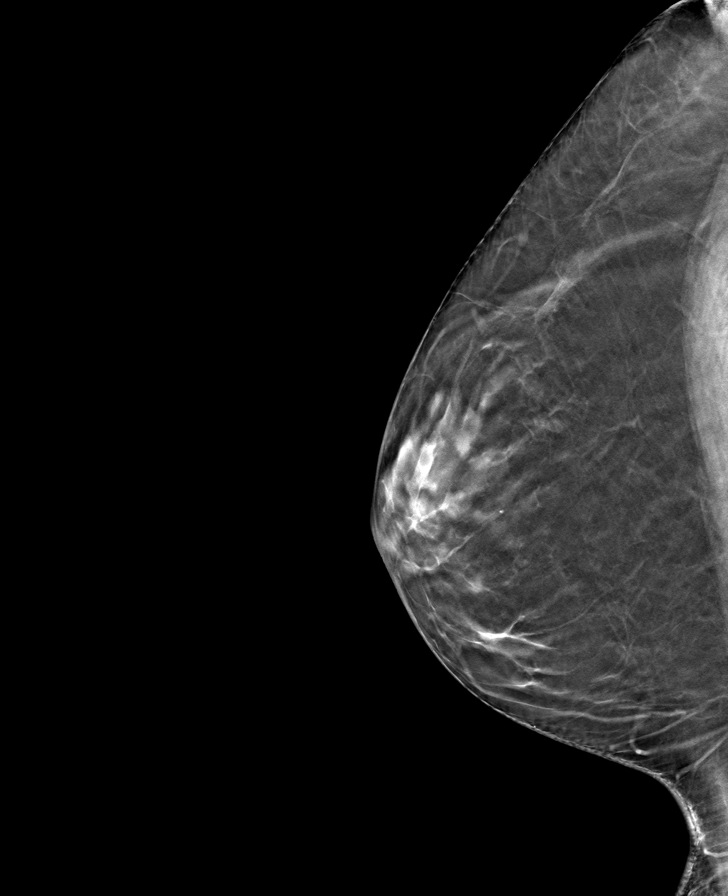

[L MLO tomo · tomo slice 35/70.0]
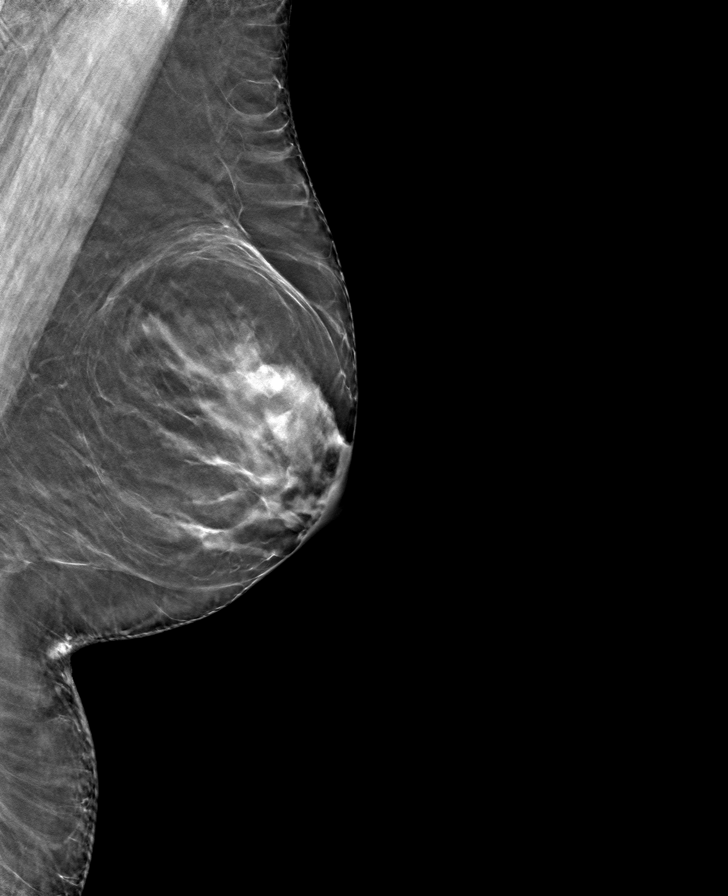

[8 of 24 positions shown; findings below may reference images not displayed]

ACR Breast Density Category b: There are scattered areas of
fibroglandular density.
FINDINGS: No focal or suspicious mammographic findings are identified in
either breast.

New

Targeted ultrasound is performed, showing no focal or suspicious
sonographic abnormality along the lateral aspect of the left breast.
IMPRESSION: 1. No mammographic evidence of malignancy in either breast.
2. Unremarkable ultrasound evaluation of the lateral left breast.

RECOMMENDATION:
1. Clinical follow-up recommended for the intermittently painful
area of concern in the left breast. Any further workup should be
based on clinical grounds.
2. Screening mammogram at age 39 unless there are persistent or
intervening clinical concerns. (Code:L0-C-ZHW)

I have discussed the findings and recommendations with the patient.
If applicable, a reminder letter will be sent to the patient
regarding the next appointment.

BI-RADS CATEGORY  1: Negative.

## 2023-04-07 ENCOUNTER — Encounter (HOSPITAL_BASED_OUTPATIENT_CLINIC_OR_DEPARTMENT_OTHER): Payer: Self-pay

## 2023-04-08 ENCOUNTER — Ambulatory Visit (INDEPENDENT_AMBULATORY_CARE_PROVIDER_SITE_OTHER): Payer: Self-pay | Admitting: Cardiology

## 2023-04-08 ENCOUNTER — Encounter (HOSPITAL_BASED_OUTPATIENT_CLINIC_OR_DEPARTMENT_OTHER): Payer: Self-pay | Admitting: Cardiology

## 2023-04-08 VITALS — BP 106/60 | HR 63 | Ht 63.0 in | Wt 179.0 lb

## 2023-04-08 DIAGNOSIS — R5383 Other fatigue: Secondary | ICD-10-CM

## 2023-04-08 DIAGNOSIS — I493 Ventricular premature depolarization: Secondary | ICD-10-CM

## 2023-04-08 DIAGNOSIS — Z712 Person consulting for explanation of examination or test findings: Secondary | ICD-10-CM

## 2023-04-08 DIAGNOSIS — R002 Palpitations: Secondary | ICD-10-CM

## 2023-04-08 NOTE — Progress Notes (Signed)
 Cardiology Office Note:  .   Date:  04/08/2023  ID:  Belinda Rice, DOB 08-Oct-1990, MRN [id number] PCP: Jeani Sow, MD  [facility] Providers Cardiologist:  Jodelle Red, MD {  History of Present Illness: .   Belinda Rice is a 33 y.o. 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 She was previously seen by Dr. Shari Prows on 06/19/21.  Pertinent CV history: May 08, 2021 echo normal. 2021-05-08 monitor with 6.2% PVCs. Did not tolerate metoprolol. Started on diltiazem.  Has history of PVCS. First medication was metoprolol--had bad bruising, shortness of breath. Then changed to diltiazem and felt great, went back to the gym, able to run. Then about a year ago, started feeling more fatigue, stabbing sharp chest pains similar to her initial symptoms. Gets lightheaded and fatigued currently.   Saw an EP in 05/08/18 in [place] at [facility] (Dr. Elon Jester), was being monitored for her palpitations but no treatment at that time.  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 Father passed away in 08-May-2016. Mother has a benign murmur. Brother died of cerebral aneurysm, pat grandmother also with cerebral aneurysm.  Per patient, diagnosed with EDS by Dr. Steward Drone. Notes from 08-May-2021 reviewed.  Today: Here with her mother today. Reviewed her monitor results today. Showed her her actual results and patterns.  Does have fatigue in general, feels worse in the summer, asking for handicap placard. Reviewed cardiac criteria for this, which she does not meet. She is hydrating, using electrolytes.   Activity limited by knee pain right now.   ROS: Denies chest pain, shortness of breath at rest. No PND, orthopnea, LE edema or unexpected weight gain. No syncope. ROS otherwise negative except as noted.   Studies Reviewed: Marland Kitchen    EKG:       Patch Wear Time:  6 days and 3 hours (2025-01-14T09:26:25-0500 to 2025-01-20T12:35:36-0500)   Patient  had a min HR of 46 bpm, max HR of 144 bpm, and avg HR of 78 bpm. Predominant underlying rhythm was Sinus Rhythm. No Isolated SVEs, SVE Couplets, or SVE Triplets were present. Isolated VEs were frequent (7.6%), VE Couplets were rare (<1.0%), and no VE Triplets were present. Ventricular Bigeminy and Trigeminy were present. No VT, SVT, atrial fibrillation, high degree block, or pauses noted. There was triggered events, which was sinus with PVC. No high risk arrhythmias detected.   Physical Exam:   VS:  BP 106/60 (BP Location: Left Arm, Patient Position: Sitting, Cuff Size: Normal)   Pulse 63   Ht 5\' 3"  (1.6 m)   Wt 179 lb (81.2 kg)   SpO2 98%   BMI 31.71 kg/m    Wt Readings from Last 3 Encounters:  04/08/23 179 lb (81.2 kg)  10/12/21 171 lb (77.6 kg)  08/24/21 171 lb 2 oz (77.6 kg)    GEN: Well nourished, well developed in no acute distress HEENT: Normal, moist mucous membranes NECK: No JVD CARDIAC: regular rhythm with premature beats, normal S1 and S2, no rubs or gallops. No murmur. VASCULAR: Radial and DP pulses 2+ bilaterally. No carotid bruits RESPIRATORY:  Clear to auscultation without rales, wheezing or rhonchi  ABDOMEN: Soft, non-tender, non-distended MUSCULOSKELETAL:  Ambulates independently SKIN: Warm and dry, no edema NEUROLOGIC:  Alert and oriented x 3. No focal neuro deficits             noted. PSYCHIATRIC:  Normal affect    ASSESSMENT AND PLAN: .  Palpitations PVCs Chronic fatigue -reviewed her monitor results, reassuring. PVC burden within a good range, high and low heart rate appropriate -continue diltiazem 120 mg daily -reviewed red flag warning signs that need immediate medical attention -discussed gradually increasing activity as tolerated  Dispo: Planning for law school in the future, hoping for [employer], will need to establish care with cardiology at that time  Signed, Jodelle Red, MD   Jodelle Red, MD, PhD, [facility]  [facility]  HeartCare  [place]  Heart & Vascular at [facility] at [facility] [address] [place], [place] [address] [phone]

## 2023-04-08 NOTE — Patient Instructions (Signed)
 Medication Instructions:  No changes *If you need a refill on your cardiac medications before your next appointment, please call your pharmacy*  Lab Work: No labs If you have labs (blood work) drawn today and your tests are completely normal, you will receive your results only by: MyChart Message (if you have MyChart) OR A paper copy in the mail If you have any lab test that is abnormal or we need to change your treatment, we will call you to review the results.  Testing/Procedures: No testing  Follow-Up: At [facility], you and your health needs are our priority.  As part of our continuing mission to provide you with exceptional heart care, we have created designated Provider Care Teams.  These Care Teams include your primary Cardiologist (physician) and Advanced Practice Providers (APPs -  Physician Assistants and Nurse Practitioners) who all work together to provide you with the care you need, when you need it.  We recommend signing up for the patient portal called "MyChart".  Sign up information is provided on this After Visit Summary.  MyChart is used to connect with patients for Virtual Visits (Telemedicine).  Patients are able to view lab/test results, encounter notes, upcoming appointments, etc.  Non-urgent messages can be sent to your provider as well.   To learn more about what you can do with MyChart, go to [email or web address].    Your next appointment:   As needed

## 2023-04-10 ENCOUNTER — Telehealth (HOSPITAL_BASED_OUTPATIENT_CLINIC_OR_DEPARTMENT_OTHER): Payer: Self-pay | Admitting: Licensed Clinical Social Worker

## 2023-04-10 NOTE — Progress Notes (Unsigned)
 Heart and Vascular Care Navigation  04/10/2023  Belinda Rice Jul 28, 1990 [id number]  Reason for Referral: uninsured Patient is participating in a Managed Medicaid Plan:No, declining to apply at this time  Engaged with patient by telephone for initial visit for Heart and Vascular Care Coordination.                                                                                                   Assessment:        LCSW was able to reach pt this afternoon at [phone]. Introduced self, role, reason for call. Confirmed home address, PCP, currently uninsured. Pt resides in [place] with her family. Moved from [place], is in school (graduates in May) and currently uninsured. Pt shared she previously had [place] [employer] through [employer] but it was costing her close to $350 a month for coverage. She states she doesn't want to apply for any "government insurance." Therefore we discussed how to apply for the [employer]. Discussed it is not insurance but rather assistance program. Pt open to applying, will send my information for any f/u questons.   Otherwise doing okay with housing, transportation, food and medications at this time. Encouraged her to let us know if any additional questions/concerns arise.                             HRT/VAS Care Coordination     Patients Home Cardiology Office --  [facility]   Outpatient Care Team Social Worker   Social Worker Name: Octavio Graves, [place], [phone]   Living arrangements for the past 2 months Single Family Home   Lives with: Parents   Patient Current Insurance Coverage Self-Pay   Patient Has Concern With Paying Medical Bills Yes   Patient Concerns With Medical Bills uninsured, denied for insurancep;  declining to apply for Medicaid   Medical Bill Referrals: [facility] Financial Assistance   Does Patient Have Prescription Coverage? No       Social History:                                                                              SDOH Screenings   Food Insecurity: No Food Insecurity (04/10/2023)  Housing: Low Risk  (04/10/2023)  Transportation Needs: No Transportation Needs (04/10/2023)  Utilities: Not At Risk (04/10/2023)  Depression (PHQ2-9): Low Risk  (05/11/2021)  Financial Resource Strain: Low Risk  (04/10/2023)  Tobacco Use: Low Risk  (04/08/2023)  Health Literacy: Adequate Health Literacy (04/10/2023)    SDOH Interventions: Financial Resources:  Surveyor, quantity Strain Interventions: Glass blower/designer Counseling for [employer] Program  Food Insecurity:  Food Insecurity Interventions: Intervention Not Indicated  Housing Insecurity:  Housing Interventions: Intervention Not Indicated  Transportation:   Transportation Interventions: Intervention Not  Indicated    Other Care Navigation Interventions:     Provided Pharmacy assistance resources  Pt denied any issues at this time  Patient expressed Mental Health concerns No. Managed by PCP   Follow-up plan:   LCSW will mail the following: my card, [facility] Financial Assistance application, blank letter of support. Will f/u to ensure it is received and answer any additional questions.

## 2023-04-18 ENCOUNTER — Telehealth (HOSPITAL_BASED_OUTPATIENT_CLINIC_OR_DEPARTMENT_OTHER): Payer: Self-pay | Admitting: Licensed Clinical Social Worker

## 2023-04-18 NOTE — Telephone Encounter (Signed)
 H&V Care Navigation CSW Progress Note  Clinical Social Worker contacted patient by phone to f/u on assistance application mailed to her. Was able to reach her at [phone]. She has not yet received them, will call me if they come today or Monday. If I dont hear from her will f/u Tuesday to check in or find alternate way to provide pt application.  Patient is participating in a Managed Medicaid Plan:  No, self pay only  SDOH Screenings   Food Insecurity: No Food Insecurity (04/10/2023)  Housing: Low Risk  (04/10/2023)  Transportation Needs: No Transportation Needs (04/10/2023)  Utilities: Not At Risk (04/10/2023)  Depression (PHQ2-9): Low Risk  (05/11/2021)  Financial Resource Strain: Low Risk  (04/10/2023)  Tobacco Use: Low Risk  (04/08/2023)  Health Literacy: Adequate Health Literacy (04/10/2023)    Octavio Graves, MSW, LCSW Clinical Social Worker II [facility] Health Heart/Vascular Care Navigation  [phone]- work cell phone (preferred) [phone]- desk phone'

## 2023-04-22 ENCOUNTER — Telehealth: Payer: Self-pay | Admitting: Licensed Clinical Social Worker

## 2023-04-22 NOTE — Telephone Encounter (Signed)
 H&V Care Navigation CSW Progress Note  Clinical Social Worker contacted patient by phone to f/u on assistance applications mailed to her, no answer. Left voicemail encouraging pt to call me back if still not received or any additional questions. Will re-attempt pt later this week.   Patient is participating in a Managed Medicaid Plan:  No, self pay only  SDOH Screenings   Food Insecurity: No Food Insecurity (04/10/2023)  Housing: Low Risk  (04/10/2023)  Transportation Needs: No Transportation Needs (04/10/2023)  Utilities: Not At Risk (04/10/2023)  Depression (PHQ2-9): Low Risk  (05/11/2021)  Financial Resource Strain: Low Risk  (04/10/2023)  Tobacco Use: Low Risk  (04/08/2023)  Health Literacy: Adequate Health Literacy (04/10/2023)    Octavio Graves, MSW, LCSW Clinical Social Worker II [facility] Health Heart/Vascular Care Navigation  [phone]- work cell phone (preferred) [phone]- desk phone

## 2023-04-25 ENCOUNTER — Telehealth (HOSPITAL_BASED_OUTPATIENT_CLINIC_OR_DEPARTMENT_OTHER): Payer: Self-pay | Admitting: Licensed Clinical Social Worker

## 2023-04-25 NOTE — Telephone Encounter (Signed)
 [facility] Care Navigation CSW Progress Note  Clinical Social Worker contacted patient by phone to f/u on assistance applications mailed to her, no answer. Left 2nd voicemail encouraging pt to call me back if still not received or any additional questions. Remain available should pt re-engage with care navigation team.   Patient is participating in a Managed Medicaid Plan:  No, self pay only  SDOH Screenings   Food Insecurity: No Food Insecurity (04/10/2023)  Housing: Low Risk  (04/10/2023)  Transportation Needs: No Transportation Needs (04/10/2023)  Utilities: Not At Risk (04/10/2023)  Depression (PHQ2-9): Low Risk  (05/11/2021)  Financial Resource Strain: Low Risk  (04/10/2023)  Tobacco Use: Low Risk  (04/08/2023)  Health Literacy: Adequate Health Literacy (04/10/2023)    Octavio Graves, MSW, LCSW Clinical Social Worker II [facility] Navigation  [phone]- work cell phone (preferred) [phone]- desk phone

## 2024-01-08 ENCOUNTER — Other Ambulatory Visit: Payer: Self-pay | Admitting: Cardiology

## 2024-01-28 ENCOUNTER — Ambulatory Visit (HOSPITAL_BASED_OUTPATIENT_CLINIC_OR_DEPARTMENT_OTHER): Payer: Self-pay | Admitting: Orthopaedic Surgery

## 2024-03-16 ENCOUNTER — Other Ambulatory Visit: Payer: Self-pay | Admitting: Cardiology

## 2024-03-18 MED ORDER — DILTIAZEM HCL ER COATED BEADS 120 MG PO CP24: 120.0000 mg | ORAL_CAPSULE | Freq: Every day | ORAL | 0 refills | Status: AC

## 2024-03-18 NOTE — Addendum Note (Signed)
 Addended by: BLUFORD, Jonni Oelkers L on: 03/18/2024 03:37 PM   Modules accepted: Orders

## 2024-03-18 NOTE — Telephone Encounter (Signed)
 Labs Normal on 12/19/23
